# Patient Record
Sex: Female | Born: 1985 | Race: White | Hispanic: No | Marital: Single | Smoking: Current some day smoker
Health system: Southern US, Community
[De-identification: ages and names within clinical notes are randomized; demographics above are authoritative.]

## PROBLEM LIST (undated history)

## (undated) DIAGNOSIS — N760 Acute vaginitis: Secondary | ICD-10-CM

## (undated) DIAGNOSIS — F419 Anxiety disorder, unspecified: Secondary | ICD-10-CM

## (undated) DIAGNOSIS — B9689 Other specified bacterial agents as the cause of diseases classified elsewhere: Secondary | ICD-10-CM

## (undated) DIAGNOSIS — F410 Panic disorder [episodic paroxysmal anxiety] without agoraphobia: Secondary | ICD-10-CM

---
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
## 2007-11-07 ENCOUNTER — Emergency Department (HOSPITAL_COMMUNITY): Admission: EM | Admit: 2007-11-07 | Discharge: 2007-11-07 | Payer: Self-pay | Admitting: Emergency Medicine

## 2007-11-29 ENCOUNTER — Emergency Department (HOSPITAL_COMMUNITY): Admission: EM | Admit: 2007-11-29 | Discharge: 2007-11-30 | Payer: Self-pay | Admitting: Emergency Medicine

## 2008-05-21 ENCOUNTER — Encounter: Admission: RE | Admit: 2008-05-21 | Discharge: 2008-05-21 | Payer: Self-pay | Admitting: General Practice

## 2009-03-15 ENCOUNTER — Emergency Department (HOSPITAL_COMMUNITY): Admission: EM | Admit: 2009-03-15 | Discharge: 2009-03-15 | Payer: Self-pay | Admitting: *Deleted

## 2009-08-23 ENCOUNTER — Emergency Department (HOSPITAL_COMMUNITY): Admission: EM | Admit: 2009-08-23 | Discharge: 2009-08-23 | Payer: Self-pay | Admitting: Emergency Medicine

## 2010-12-21 ENCOUNTER — Emergency Department (HOSPITAL_COMMUNITY)
Admission: EM | Admit: 2010-12-21 | Discharge: 2010-12-21 | Disposition: A | Discharge status: Home / Self Care | Payer: Self-pay | Attending: Emergency Medicine | Admitting: Emergency Medicine

## 2010-12-21 DIAGNOSIS — R109 Unspecified abdominal pain: Secondary | ICD-10-CM | POA: Insufficient documentation

## 2010-12-21 DIAGNOSIS — R197 Diarrhea, unspecified: Secondary | ICD-10-CM | POA: Insufficient documentation

## 2010-12-21 LAB — URINALYSIS, ROUTINE W REFLEX MICROSCOPIC
Bilirubin Urine: NEGATIVE
Glucose, UA: NEGATIVE mg/dL
Hgb urine dipstick: NEGATIVE
Ketones, ur: NEGATIVE mg/dL
Nitrite: NEGATIVE
Protein, ur: NEGATIVE mg/dL
Specific Gravity, Urine: 1.011 (ref 1.005–1.030)
Urobilinogen, UA: 0.2 mg/dL (ref 0.0–1.0)
pH: 6 (ref 5.0–8.0)

## 2010-12-21 LAB — POCT I-STAT, CHEM 8
BUN: 13 mg/dL (ref 6–23)
Calcium, Ion: 1.2 mmol/L (ref 1.12–1.32)
Creatinine, Ser: 1 mg/dL (ref 0.4–1.2)
Hemoglobin: 13.6 g/dL (ref 12.0–15.0)
TCO2: 22 mmol/L (ref 0–100)

## 2010-12-21 LAB — POCT PREGNANCY, URINE: Preg Test, Ur: NEGATIVE

## 2011-01-16 LAB — URINALYSIS, ROUTINE W REFLEX MICROSCOPIC
Bilirubin Urine: NEGATIVE
Glucose, UA: NEGATIVE mg/dL
Hgb urine dipstick: NEGATIVE
Ketones, ur: NEGATIVE mg/dL
Nitrite: NEGATIVE
Protein, ur: NEGATIVE mg/dL
Specific Gravity, Urine: 1.019 (ref 1.005–1.030)
Urobilinogen, UA: 0.2 mg/dL (ref 0.0–1.0)
pH: 6 (ref 5.0–8.0)

## 2011-01-16 LAB — BASIC METABOLIC PANEL WITH GFR
CO2: 29 meq/L (ref 19–32)
Calcium: 9.4 mg/dL (ref 8.4–10.5)
Creatinine, Ser: 0.83 mg/dL (ref 0.4–1.2)
GFR calc Af Amer: >60 mL/min (ref >60)
GFR calc non Af Amer: >60 mL/min (ref >60)
Sodium: 141 meq/L (ref 135–145)

## 2011-01-16 LAB — DIFFERENTIAL
Basophils Absolute: 0.1 10*3/uL (ref 0.0–0.1)
Basophils Relative: 1 % (ref 0–1)
Eosinophils Absolute: 0.2 10*3/uL (ref 0.0–0.7)
Eosinophils Relative: 3 % (ref 0–5)
Lymphocytes Relative: 39 % (ref 12–46)
Lymphs Abs: 2 K/uL (ref 0.7–4.0)
Monocytes Absolute: 0.4 K/uL (ref 0.1–1.0)
Monocytes Relative: 9 % (ref 3–12)
Neutro Abs: 2.4 K/uL (ref 1.7–7.7)
Neutrophils Relative %: 48 % (ref 43–77)

## 2011-01-16 LAB — CBC
HCT: 40.3 % (ref 36.0–46.0)
Hemoglobin: 13.9 g/dL (ref 12.0–15.0)
MCHC: 34.3 g/dL (ref 30.0–36.0)
MCV: 94.4 fL (ref 78.0–100.0)
Platelets: 296 10*3/uL (ref 150–400)
RBC: 4.28 MIL/uL (ref 3.87–5.11)
RDW: 13 % (ref 11.5–15.5)
WBC: 5 K/uL (ref 4.0–10.5)

## 2011-01-16 LAB — POCT PREGNANCY, URINE: Preg Test, Ur: NEGATIVE

## 2011-01-16 LAB — BASIC METABOLIC PANEL
BUN: 9 mg/dL (ref 6–23)
Chloride: 104 mEq/L (ref 96–112)
Glucose, Bld: 95 mg/dL (ref 70–99)
Potassium: 3.7 mEq/L (ref 3.5–5.1)

## 2011-06-29 LAB — I-STAT 8, (EC8 V) (CONVERTED LAB)
Glucose, Bld: 83
Potassium: 4
TCO2: 28
pH, Ven: 7.337 — ABNORMAL HIGH

## 2011-06-29 LAB — POCT I-STAT CREATININE
Creatinine, Ser: 0.9
Operator id: 295021

## 2016-07-24 ENCOUNTER — Emergency Department (HOSPITAL_COMMUNITY): Payer: Self-pay

## 2016-07-24 ENCOUNTER — Emergency Department (HOSPITAL_COMMUNITY)
Admission: EM | Admit: 2016-07-24 | Discharge: 2016-07-24 | Disposition: A | Discharge status: Home / Self Care | Payer: Self-pay | Attending: Emergency Medicine | Admitting: Emergency Medicine

## 2016-07-24 ENCOUNTER — Encounter (HOSPITAL_COMMUNITY): Payer: Self-pay | Admitting: Emergency Medicine

## 2016-07-24 DIAGNOSIS — F12920 Cannabis use, unspecified with intoxication, uncomplicated: Secondary | ICD-10-CM

## 2016-07-24 DIAGNOSIS — E876 Hypokalemia: Secondary | ICD-10-CM | POA: Insufficient documentation

## 2016-07-24 DIAGNOSIS — R000 Tachycardia, unspecified: Secondary | ICD-10-CM

## 2016-07-24 DIAGNOSIS — F12129 Cannabis abuse with intoxication, unspecified: Secondary | ICD-10-CM | POA: Insufficient documentation

## 2016-07-24 HISTORY — DX: Other specified bacterial agents as the cause of diseases classified elsewhere: B96.89

## 2016-07-24 HISTORY — DX: Other specified bacterial agents as the cause of diseases classified elsewhere: N76.0

## 2016-07-24 LAB — BASIC METABOLIC PANEL
ANION GAP: 12 (ref 5–15)
BUN: 12 mg/dL (ref 6–20)
CALCIUM: 9.3 mg/dL (ref 8.9–10.3)
CO2: 20 mmol/L — AB (ref 22–32)
Chloride: 103 mmol/L (ref 101–111)
Creatinine, Ser: 1.03 mg/dL — ABNORMAL HIGH (ref 0.44–1.00)
Glucose, Bld: 111 mg/dL — ABNORMAL HIGH (ref 65–99)
Potassium: 2.8 mmol/L — ABNORMAL LOW (ref 3.5–5.1)
Sodium: 135 mmol/L (ref 135–145)

## 2016-07-24 LAB — CBC
HCT: 37.5 % (ref 36.0–46.0)
HEMOGLOBIN: 12.5 g/dL (ref 12.0–15.0)
MCH: 29.7 pg (ref 26.0–34.0)
MCHC: 33.3 g/dL (ref 30.0–36.0)
MCV: 89.1 fL (ref 78.0–100.0)
Platelets: 356 10*3/uL (ref 150–400)
RBC: 4.21 MIL/uL (ref 3.87–5.11)
RDW: 13.1 % (ref 11.5–15.5)
WBC: 10 10*3/uL (ref 4.0–10.5)

## 2016-07-24 LAB — I-STAT TROPONIN, ED: TROPONIN I, POC: 0 ng/mL (ref 0.00–0.08)

## 2016-07-24 MED ORDER — POTASSIUM CHLORIDE CRYS ER 20 MEQ PO TBCR
60.0000 meq | EXTENDED_RELEASE_TABLET | Freq: Once | ORAL | Status: AC
  Administered 2016-07-24: 60 meq via ORAL
  Filled 2016-07-24: qty 3

## 2016-07-24 NOTE — Discharge Instructions (Signed)
Avoid using marijuana in the future to prevent the symptoms from reoccurring. Try to increase the potassium content of your feet, see attachment for foods high in potassium. [facility] or call the number circled on your discharge paperwork to establish care with a primary care provider. Please return to emergency department if you develop any new or worsening symptoms.

## 2016-07-24 NOTE — ED Triage Notes (Signed)
C/o tachycardia x 4 hours since eating marijuana brownies.  Denies pain.

## 2016-07-24 NOTE — ED Provider Notes (Signed)
[facility] Provider Note   CSN: [id number] Arrival date & time: 07/24/16  0341     History   Chief Complaint Chief Complaint  Patient presents with  . Tachycardia    after eating marijuana brownies    HPI Bonnie Turner is a 30 y.o. female who was previously healthy who presents with tachycardia and chest tightness after eating marijuana brownies. Patient states she ate the brownies around 12:30 or 1 AM. She states she normally smokes marijuana 2 times per week and has never experienced symptoms like these. She does not know who she got the brownies from is concerned that it was laced. On my interview with the patient around 7 hours after ingestion, patient states she is basically feeling back to normal with some residual, mild chest tightness and "woozy feeling" in her head. Patient denies any chest pain, pleuritic symptoms, shortness of breath, abdominal pain, nausea, vomiting, urinary symptoms.  HPI  Past Medical History:  Diagnosis Date  . BV (bacterial vaginosis)     There are no active problems to display for this patient.   History reviewed. No pertinent surgical history.  OB History    No data available       Home Medications    Prior to Admission medications   Medication Sig Start Date End Date Taking? Authorizing Provider  metroNIDAZOLE (FLAGYL) 500 MG tablet Take 500 mg by mouth 2 (two) times daily. For 7 days   Yes Historical Provider, MD    Family History No family history on file.  Social History Social History  Substance Use Topics  . Smoking status: Never Smoker  . Smokeless tobacco: Never Used  . Alcohol use Yes     Allergies   Hepatitis b virus vaccine   Review of Systems Review of Systems  Constitutional: Negative for chills and fever.  HENT: Negative for facial swelling and sore throat.   Respiratory: Positive for chest tightness. Negative for shortness of breath.   Cardiovascular: Positive for palpitations. Negative  for chest pain.  Gastrointestinal: Negative for abdominal pain, nausea and vomiting.  Genitourinary: Negative for dysuria.  Musculoskeletal: Negative for back pain.  Skin: Negative for rash and wound.  Neurological: Negative for headaches.  Psychiatric/Behavioral: The patient is not nervous/anxious.      Physical Exam Updated Vital Signs BP 101/70 (BP Location: Right Arm)   Pulse 99   Temp 98.8 F (37.1 C) (Oral)   Resp 17   LMP 07/06/2016   SpO2 98%   Physical Exam  Constitutional: She appears well-developed and well-nourished. No distress.  HENT:  Head: Normocephalic and atraumatic.  Mouth/Throat: Oropharynx is clear and moist. No oropharyngeal exudate.  Eyes: Conjunctivae are normal. Pupils are equal, round, and reactive to light. Right eye exhibits no discharge. Left eye exhibits no discharge. No scleral icterus.  Neck: Normal range of motion. Neck supple. No thyromegaly present.  Cardiovascular: Normal rate, regular rhythm, normal heart sounds and intact distal pulses.  Exam reveals no gallop and no friction rub.   No murmur heard. Pulmonary/Chest: Effort normal and breath sounds normal. No stridor. No respiratory distress. She has no wheezes. She has no rales.  Abdominal: Soft. Bowel sounds are normal. She exhibits no distension. There is no tenderness. There is no rebound and no guarding.  Musculoskeletal: She exhibits no edema.  Lymphadenopathy:    She has no cervical adenopathy.  Neurological: She is alert. Coordination normal.  Skin: Skin is warm and dry. No rash noted. She is not diaphoretic.  No pallor.  Psychiatric: She has a normal mood and affect.  Nursing note and vitals reviewed.    ED Treatments / Results  Labs (all labs ordered are listed, but only abnormal results are displayed) Labs Reviewed  BASIC METABOLIC PANEL - Abnormal; Notable for the following:       Result Value   Potassium 2.8 (*)    CO2 20 (*)    Glucose, Bld 111 (*)    Creatinine, Ser  1.03 (*)    All other components within normal limits  CBC  I-STAT TROPOININ, ED    EKG  EKG Interpretation  Date/Time:  Monday July 24 2016 03:48:43 EDT Ventricular Rate:  172 PR Interval:  128 QRS Duration: 76 QT Interval:  284 QTC Calculation: 480 R Axis:   106 Text Interpretation:  Sinus tachycardia Rightward axis No previous tracing Confirmed by Denton LankSTEINL  MD, Caryn BeeKEVIN ([id number]) on 07/24/2016 8:11:05 AM       Radiology Dg Chest 2 View  Result Date: 07/24/2016 CLINICAL DATA:  30 year old female with tachycardia EXAM: CHEST  2 VIEW COMPARISON:  Chest radiograph dated 04/17/2009 FINDINGS: The heart size and mediastinal contours are within normal limits. Both lungs are clear. The visualized skeletal structures are unremarkable. IMPRESSION: No active cardiopulmonary disease. Electronically Signed   By: Elgie CollardArash  Radparvar M.D.   On: 07/24/2016 04:35    Procedures Procedures (including critical care time)  Medications Ordered in ED Medications  potassium chloride SA (K-DUR,KLOR-CON) CR tablet 60 mEq (60 mEq Oral Given 07/24/16 [id number])     Initial Impression / Assessment and Plan / ED Course  I have reviewed the triage vital signs and the nursing notes.  Pertinent labs & imaging results that were available during my care of the patient were reviewed by me and considered in my medical decision making (see chart for details).  Clinical Course    Patient with resolved tachycardia and resolving chest tightness following cannabis use. Assume cannabis intoxication. EKG shows sinus tachycardia, rightward axis. CBC unremarkable. BMP shows potassium 2.8, CO2 20, glucose 111, creatinine 1.03. Potassium was replaced orally in the ED. Patient has history of hypokalemia. Troponin 0.00. CXR shows no active cardiopulmonary disease. Patient feeling well and comfortable for discharge. Patient advised to increase her potassium intake in her diet. Patient also advised to discontinue marijuana use.  Patient was to follow up and establish care with a primary care provider. Patient understands and agrees with plan. Patient also evaluated by Dr. Denton LankSteinl who agrees with plan. Patient discharged in satisfactory condition.  Final Clinical Impressions(s) / ED Diagnoses   Final diagnoses:  Tachycardia  Cannabis intoxication without complication (HCC)  Hypokalemia    New Prescriptions New Prescriptions   No medications on file     Emi Holeslexandra M Menna Abeln, PA-C 07/24/16 0902    Cathren LaineKevin Steinl, MD 07/24/16 [phone]

## 2016-09-11 ENCOUNTER — Ambulatory Visit (HOSPITAL_COMMUNITY)
Admission: EM | Admit: 2016-09-11 | Discharge: 2016-09-11 | Disposition: A | Discharge status: Home / Self Care | Payer: Self-pay | Attending: Family Medicine | Admitting: Family Medicine

## 2016-09-11 ENCOUNTER — Encounter (HOSPITAL_COMMUNITY): Payer: Self-pay | Admitting: Emergency Medicine

## 2016-09-11 DIAGNOSIS — J40 Bronchitis, not specified as acute or chronic: Secondary | ICD-10-CM

## 2016-09-11 MED ORDER — HYDROCODONE-HOMATROPINE 5-1.5 MG/5ML PO SYRP: 5.0000 mL | ORAL_SOLUTION | Freq: Four times a day (QID) | ORAL | 0 refills | Status: DC | PRN

## 2016-09-11 MED ORDER — AZITHROMYCIN 250 MG PO TABS: 250.0000 mg | ORAL_TABLET | Freq: Every day | ORAL | 0 refills | Status: DC

## 2016-09-11 NOTE — ED Provider Notes (Signed)
[facility]    CSN: [id number] Arrival date & time: 09/11/16  1654     History   Chief Complaint Chief Complaint  Patient presents with  . Cough    HPI Bonnie Turner is a 30 y.o. female.   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 She comes in with 4 days of progressive cough and chest burning. She's had a severe cough to cause some incontinence.  Patient does not smoke cigarettes. She does not have asthma. She's had no fever.      Past Medical History:  Diagnosis Date  . BV (bacterial vaginosis)     There are no active problems to display for this patient.   History reviewed. No pertinent surgical history.  OB History    No data available       Home Medications    Prior to Admission medications   Medication Sig Start Date End Date Taking? Authorizing Provider  OVER THE COUNTER MEDICATION otc cough medicine   Yes Historical Provider, MD  azithromycin (ZITHROMAX) 250 MG tablet Take 1 tablet (250 mg total) by mouth daily. Take first 2 tablets together, then 1 every day until finished. 09/11/16   Elvina SidleKurt Audreena Sachdeva, MD  HYDROcodone-homatropine [facility](HYCODAN) 5-1.5 MG/5ML syrup Take 5 mLs by mouth every 6 (six) hours as needed for cough. 09/11/16   Elvina SidleKurt Jaeleen Inzunza, MD    Family History No family history on file.  Social History Social History  Substance Use Topics  . Smoking status: Never Smoker  . Smokeless tobacco: Never Used  . Alcohol use Yes     Allergies   Hepatitis b virus vaccine   Review of Systems Review of Systems  Constitutional: Negative.   HENT: Positive for sore throat.   Respiratory: Positive for cough.   Cardiovascular: Positive for chest pain.  Neurological: Negative.      Physical Exam Triage Vital Signs ED Triage Vitals  Enc Vitals Group     BP 09/11/16 1716 104/68     Pulse Rate 09/11/16 1716 79     Resp 09/11/16 1716 16     Temp 09/11/16 1716 98.8 F (37.1 C)     Temp Source 09/11/16 1716 Oral     SpO2  09/11/16 1716 100 %     Weight --      Height --      Head Circumference --      Peak Flow --      Pain Score 09/11/16 1715 6     Pain Loc --      Pain Edu? --      Excl. in GC? --    No data found.   Updated Vital Signs BP 104/68 (BP Location: Left Arm)   Pulse 79   Temp 98.8 F (37.1 C) (Oral)   Resp 16   LMP 09/10/2016   SpO2 100%   Physical Exam  Constitutional: She is oriented to person, place, and time. She appears well-developed and well-nourished.  HENT:  Right Ear: External ear normal.  Left Ear: External ear normal.  Mouth/Throat: Oropharynx is clear and moist.  Eyes: Conjunctivae and EOM are normal.  Neck: Normal range of motion. Neck supple.  Cardiovascular: Normal heart sounds.   Pulmonary/Chest: Effort normal and breath sounds normal.  Musculoskeletal: Normal range of motion.  Neurological: She is alert and oriented to person, place, and time.  Skin: Skin is warm and dry.  Nursing note and vitals reviewed.    UC Treatments / Results  Labs (all labs ordered are listed, but only abnormal results are displayed) Labs Reviewed - No data to display  EKG  EKG Interpretation None       Radiology No results found.  Procedures Procedures (including critical care time)  Medications Ordered in UC Medications - No data to display   Initial Impression / Assessment and Plan / UC Course  I have reviewed the triage vital signs and the nursing notes.  Pertinent labs & imaging results that were available during my care of the patient were reviewed by me and considered in my medical decision making (see chart for details).  Clinical Course     Final Clinical Impressions(s) / UC Diagnoses   Final diagnoses:  Bronchitis    New Prescriptions New Prescriptions   AZITHROMYCIN (ZITHROMAX) 250 MG TABLET    Take 1 tablet (250 mg total) by mouth daily. Take first 2 tablets together, then 1 every day until finished.   HYDROCODONE-HOMATROPINE (HYCODAN)  5-1.5 MG/5ML SYRUP    Take 5 mLs by mouth every 6 (six) hours as needed for cough.     Elvina SidleKurt Levita Monical, MD 09/11/16 Rickey [id number]

## 2016-09-11 NOTE — ED Triage Notes (Signed)
Reports 3 days of coughing.  Patient has chest congestion and feels like fire in chest.  Patient denies fever.  Patient reports sore throat, but no ear pain.

## 2016-11-27 ENCOUNTER — Ambulatory Visit (HOSPITAL_COMMUNITY)
Admission: EM | Admit: 2016-11-27 | Discharge: 2016-11-27 | Disposition: A | Discharge status: Home / Self Care | Payer: Self-pay | Attending: Internal Medicine | Admitting: Internal Medicine

## 2016-11-27 ENCOUNTER — Encounter (HOSPITAL_COMMUNITY): Payer: Self-pay | Admitting: Emergency Medicine

## 2016-11-27 DIAGNOSIS — J111 Influenza due to unidentified influenza virus with other respiratory manifestations: Secondary | ICD-10-CM

## 2016-11-27 DIAGNOSIS — R69 Illness, unspecified: Secondary | ICD-10-CM

## 2016-11-27 MED ORDER — OSELTAMIVIR PHOSPHATE 75 MG PO CAPS: 75.0000 mg | ORAL_CAPSULE | Freq: Two times a day (BID) | ORAL | 0 refills | Status: DC

## 2016-11-27 MED ORDER — BENZONATATE 100 MG PO CAPS: 100.0000 mg | ORAL_CAPSULE | Freq: Three times a day (TID) | ORAL | 0 refills | Status: DC

## 2016-11-27 NOTE — ED Triage Notes (Signed)
[facility] with a complaint of a sore throat, fatigue and general body aches x 2 days. The patient denied any fever at home.

## 2016-11-27 NOTE — ED Provider Notes (Signed)
CSN: [id number]     Arrival date & time 11/27/16  1108 History   First MD Initiated Contact with Patient 11/27/16 1259     Chief Complaint  Patient presents with  . Sore Throat  . Generalized Body Aches   (Consider location/radiation/quality/duration/timing/severity/associated sxs/prior Treatment) 31 year old female patient presents with 24-48 hour history of headache, fever, body aches, muscle aches, and congestion along with cough. Denies nausea, vomiting, or diarrhea, also denies abdominal pain, does have reduced appetite, but states she has been drinking normally.   The history is provided by the patient.  Sore Throat     Past Medical History:  Diagnosis Date  . BV (bacterial vaginosis)    History reviewed. No pertinent surgical history. History reviewed. No pertinent family history. Social History  Substance Use Topics  . Smoking status: Current Some Day Smoker    Types: Cigarettes  . Smokeless tobacco: Never Used  . Alcohol use Yes   OB History    No data available     Review of Systems  Reason unable to perform ROS: as covered in HPI.  All other systems reviewed and are negative.   Allergies  Hepatitis b virus vaccine  Home Medications   Prior to Admission medications   Medication Sig Start Date End Date Taking? Authorizing Provider  HYDROcodone-homatropine (HYCODAN) 5-1.5 MG/5ML syrup Take 5 mLs by mouth every 6 (six) hours as needed for cough.   Yes Historical Provider, MD  benzonatate (TESSALON) 100 MG capsule Take 1 capsule (100 mg total) by mouth every 8 (eight) hours. 11/27/16   Dorena Bodo, NP  oseltamivir (TAMIFLU) 75 MG capsule Take 1 capsule (75 mg total) by mouth every 12 (twelve) hours. 11/27/16   Dorena Bodo, NP   Meds Ordered and Administered this Visit  Medications - No data to display  BP 107/56 (BP Location: Right Arm)   Pulse 112   Temp 99.5 F (37.5 C) (Oral)   Resp 16   SpO2 99%  No data found.   Physical Exam   Constitutional: She is oriented to person, place, and time. She appears well-developed and well-nourished. She appears ill. No distress.  HENT:  Head: Normocephalic and atraumatic.  Right Ear: Tympanic membrane and external ear normal.  Left Ear: Tympanic membrane and external ear normal.  Nose: Rhinorrhea present. Right sinus exhibits no maxillary sinus tenderness and no frontal sinus tenderness. Left sinus exhibits no maxillary sinus tenderness and no frontal sinus tenderness.  Mouth/Throat: Uvula is midline and oropharynx is clear and moist. No oropharyngeal exudate.  Eyes: Pupils are equal, round, and reactive to light.  Neck: Normal range of motion. Neck supple. No JVD present.  Cardiovascular: Normal rate and regular rhythm.   Pulmonary/Chest: Effort normal and breath sounds normal. No respiratory distress. She has no wheezes.  Abdominal: Soft. Bowel sounds are normal. She exhibits no distension. There is no tenderness. There is no guarding.  Lymphadenopathy:       Head (right side): No submental, no submandibular and no tonsillar adenopathy present.       Head (left side): No submental, no submandibular and no tonsillar adenopathy present.    She has no cervical adenopathy.  Neurological: She is alert and oriented to person, place, and time.  Skin: Skin is warm and dry. Capillary refill takes less than 2 seconds. She is not diaphoretic.  Psychiatric: She has a normal mood and affect.  Nursing note and vitals reviewed.   Urgent Care Course     Procedures (including  critical care time)  Labs Review Labs Reviewed - No data to display  Imaging Review No results found.   Visual Acuity Review  Right Eye Distance:   Left Eye Distance:   Bilateral Distance:    Right Eye Near:   Left Eye Near:    Bilateral Near:         MDM   1. Influenza-like illness    You most likely have a viral URI such as influenza, I advise rest, plenty of fluids and management of symptoms  with over the counter medicines. For symptoms you may take Tylenol as needed every 4-6 hours for body aches or fever, not to exceed 4,000 mg a day, Take mucinex or mucinex DM ever 12 hours with a full glass of water, you may use an inhaled steroid such as Flonase, 2 sprays each nostril once a day for congestion, or an antihistamine such as Claritin or Zyrtec once a day. I have prescribed Tamiflu, take 1 tablet twice a day for 5 days, For cough, I have prescribed a medication called Tessalon. Take 1 tablet every 8 hours as needed for your cough. Should your symptoms worsen or fail to resolve, follow up with your primary care provider or return to clinic.       Dorena BodoLawrence Tijah Hane, NP 11/27/16 1314

## 2016-11-27 NOTE — Discharge Instructions (Signed)
You most likely have a viral URI such as influenza, I advise rest, plenty of fluids and management of symptoms with over the counter medicines. For symptoms you may take Tylenol as needed every 4-6 hours for body aches or fever, not to exceed 4,000 mg a day, Take mucinex or mucinex DM ever 12 hours with a full glass of water, you may use an inhaled steroid such as Flonase, 2 sprays each nostril once a day for congestion, or an antihistamine such as Claritin or Zyrtec once a day. I have prescribed Tamiflu, take 1 tablet twice a day for 5 days, For cough, I have prescribed a medication called Tessalon. Take 1 tablet every 8 hours as needed for your cough. Should your symptoms worsen or fail to resolve, follow up with your primary care provider or return to clinic.

## 2016-12-03 ENCOUNTER — Encounter (HOSPITAL_COMMUNITY): Payer: Self-pay | Admitting: *Deleted

## 2016-12-03 ENCOUNTER — Emergency Department (HOSPITAL_COMMUNITY)
Admission: EM | Admit: 2016-12-03 | Discharge: 2016-12-03 | Disposition: A | Discharge status: Home / Self Care | Payer: Self-pay | Attending: Emergency Medicine | Admitting: Emergency Medicine

## 2016-12-03 ENCOUNTER — Emergency Department (HOSPITAL_COMMUNITY): Payer: Self-pay

## 2016-12-03 DIAGNOSIS — F17210 Nicotine dependence, cigarettes, uncomplicated: Secondary | ICD-10-CM | POA: Insufficient documentation

## 2016-12-03 DIAGNOSIS — R509 Fever, unspecified: Secondary | ICD-10-CM | POA: Insufficient documentation

## 2016-12-03 DIAGNOSIS — R05 Cough: Secondary | ICD-10-CM | POA: Insufficient documentation

## 2016-12-03 DIAGNOSIS — Z79899 Other long term (current) drug therapy: Secondary | ICD-10-CM | POA: Insufficient documentation

## 2016-12-03 DIAGNOSIS — R6889 Other general symptoms and signs: Secondary | ICD-10-CM

## 2016-12-03 MED ORDER — ALBUTEROL SULFATE HFA 108 (90 BASE) MCG/ACT IN AERS
2.0000 | INHALATION_SPRAY | Freq: Once | RESPIRATORY_TRACT | Status: AC
  Administered 2016-12-03: 2 via RESPIRATORY_TRACT
  Filled 2016-12-03: qty 6.7

## 2016-12-03 NOTE — ED Provider Notes (Signed)
[facility]   CSN: [id number] Arrival date & time: 12/03/16  [id number]     History   Chief Complaint Chief Complaint  Patient presents with  . Influenza    HPI Bonnie Turner is a 31 y.o. female.  The history is provided by the patient.  Influenza  Presenting symptoms: cough, fatigue, fever, headache, myalgias, shortness of breath and sore throat   Presenting symptoms: no diarrhea   Severity:  Moderate Onset quality:  Gradual Duration:  1 week Progression:  Waxing and waning Chronicity:  New Relieved by:  Nothing Exacerbated by: cough. Associated symptoms: chills   Patient without any significant medical conditions presents with flu like illness   She was seen on 2/19 for congestion, sore throat, myalgias and given clinical diagnosis of influenza.  She was placed on tamiflu with some improvement In past 24 hours she is having worsening cough and chest burning while coughing No hemoptysis She reports continued HA and sore throat  No recent foreign travel  She smokes marijuana occasionally  She also report rash to left LE, reports it itches, thinks it is from her dogs   Past Medical History:  Diagnosis Date  . BV (bacterial vaginosis)     There are no active problems to display for this patient.   History reviewed. No pertinent surgical history.  OB History    No data available       Home Medications    Prior to Admission medications   Medication Sig Start Date End Date Taking? Authorizing Provider  benzonatate (TESSALON) 100 MG capsule Take 1 capsule (100 mg total) by mouth every 8 (eight) hours. 11/27/16   Dorena BodoLawrence Kennard, NP  HYDROcodone-homatropine [facility](HYCODAN) 5-1.5 MG/5ML syrup Take 5 mLs by mouth every 6 (six) hours as needed for cough.    Historical Provider, MD  oseltamivir (TAMIFLU) 75 MG capsule Take 1 capsule (75 mg total) by mouth every 12 (twelve) hours. 11/27/16   Dorena BodoLawrence Kennard, NP    Family History No family history  on file.  Social History Social History  Substance Use Topics  . Smoking status: Current Some Day Smoker    Types: Cigarettes  . Smokeless tobacco: Never Used  . Alcohol use Yes     Allergies   Hepatitis b virus vaccine   Review of Systems Review of Systems  Constitutional: Positive for chills, fatigue and fever.  HENT: Positive for sore throat.   Respiratory: Positive for cough and shortness of breath.   Gastrointestinal: Negative for diarrhea.  Musculoskeletal: Positive for myalgias.  Skin: Positive for rash.  Neurological: Positive for headaches.  All other systems reviewed and are negative.    Physical Exam Updated Vital Signs BP 111/84   Pulse 94   Temp 97.5 F (36.4 C) (Oral)   Resp 16   LMP 11/26/2016   SpO2 100%   Physical Exam CONSTITUTIONAL: Well developed/well nourished HEAD: Normocephalic/atraumatic EYES: EOMI/PERRL ENMT: Mucous membranes moist NECK: supple no meningeal signs SPINE/BACK:entire spine nontender CV: S1/S2 noted, no murmurs/rubs/gallops noted LUNGS: Lungs are clear to auscultation bilaterally, no apparent distress ABDOMEN: soft, nontender, no rebound or guarding, bowel sounds noted throughout abdomen GU:no cva tenderness NEURO: Pt is awake/alert/appropriate, moves all extremitiesx4.  No facial droop.   EXTREMITIES: pulses normal/equal, full ROM SKIN: warm, color normal, scattered patches of dermatitis on left LE, none on palms/soles PSYCH: no abnormalities of mood noted, alert and oriented to situation   ED Treatments / Results  Labs (all labs ordered are listed,  but only abnormal results are displayed) Labs Reviewed - No data to display  EKG  EKG Interpretation None       Radiology Dg Chest 2 View  Result Date: 12/03/2016 CLINICAL DATA:  31 year old female with flu-like symptoms. EXAM: CHEST  2 VIEW COMPARISON:  Chest radiograph dated 07/24/2016 FINDINGS: The heart size and mediastinal contours are within normal limits.  Both lungs are clear. The visualized skeletal structures are unremarkable. IMPRESSION: No active cardiopulmonary disease. Electronically Signed   By: Elgie Collard M.D.   On: 12/03/2016 06:21    Procedures Procedures (including critical care time)  Medications Ordered in ED Medications  albuterol (PROVENTIL HFA;VENTOLIN HFA) 108 (90 Base) MCG/ACT inhaler 2 puff (2 puffs Inhalation Given 12/03/16 1610)     Initial Impression / Assessment and Plan / ED Course  I have reviewed the triage vital signs and the nursing notes.  Pertinent imaging results that were available during my care of the patient were reviewed by me and considered in my medical decision making (see chart for details).     Pt here with recent flu like symptoms who presents with increased cough and chest burning cxr negative No distress noted Will d/c home Final Clinical Impressions(s) / ED Diagnoses   Final diagnoses:  Flu-like symptoms    New Prescriptions New Prescriptions   No medications on file     Zadie Rhine, MD 12/03/16 [phone]

## 2016-12-03 NOTE — ED Triage Notes (Signed)
Pt was diagnosed with the flu last Monday, was referred to the ED if symptoms worsened. Now pt reports constant coughing with burning in chest. Has been taking tamiflu (which pt finished), tessalon pearls, and flonase without relief. Also pt has a rash to L leg

## 2016-12-08 ENCOUNTER — Ambulatory Visit (HOSPITAL_COMMUNITY)
Admission: EM | Admit: 2016-12-08 | Discharge: 2016-12-08 | Disposition: A | Discharge status: Home / Self Care | Payer: Self-pay | Attending: Family Medicine | Admitting: Family Medicine

## 2016-12-08 ENCOUNTER — Encounter (HOSPITAL_COMMUNITY): Payer: Self-pay | Admitting: Family Medicine

## 2016-12-08 DIAGNOSIS — R058 Other specified cough: Secondary | ICD-10-CM

## 2016-12-08 DIAGNOSIS — R05 Cough: Secondary | ICD-10-CM

## 2016-12-08 MED ORDER — HYDROCODONE-HOMATROPINE 5-1.5 MG/5ML PO SYRP: 5.0000 mL | ORAL_SOLUTION | Freq: Four times a day (QID) | ORAL | 0 refills | Status: DC | PRN

## 2016-12-08 MED ORDER — BENZONATATE 100 MG PO CAPS: 100.0000 mg | ORAL_CAPSULE | Freq: Three times a day (TID) | ORAL | 0 refills | Status: DC | PRN

## 2016-12-08 NOTE — ED Triage Notes (Signed)
Pt here for 2 weeks of coughing and not getting better. sts taht she recently had the flu. sts she took tamiflu, mucinex and tesslon and not getting better.

## 2016-12-08 NOTE — ED Provider Notes (Signed)
CSN: [id number]     Arrival date & time 12/08/16  1012 History   None    Chief Complaint  Patient presents with  . Cough   (Consider location/radiation/quality/duration/timing/severity/associated sxs/prior Treatment) Onset: sick for 1 week with the flu Course: patient developed the cough this week. Fever, chills and diarrhea have resolved. However continues to have cough. Has tried tesslon perles and NyQUIL with much benefit  Severity: feels severe, wakes you up  Worse with: talking makes it worse  Better with: nothing   Symptoms Sputum:no  Fever: no  Shortness of breath:no  Leg Swelling:no  Heart Burn or Reflux:no  Wheezing:no  Post Nasal Drip: no   Red Flags Weight Loss:  no Immunocompromised:  no  PMH Asthma or COPD: no  PMH of Smoking: yes, intermittently   Using ACEIs: no          Past Medical History:  Diagnosis Date  . BV (bacterial vaginosis)    History reviewed. No pertinent surgical history. History reviewed. No pertinent family history. Social History  Substance Use Topics  . Smoking status: Current Some Day Smoker    Types: Cigarettes  . Smokeless tobacco: Never Used  . Alcohol use Yes   OB History    No data available     Review of Systems  Allergies  Hepatitis b virus vaccine  Home Medications   Prior to Admission medications   Medication Sig Start Date End Date Taking? Authorizing Provider  benzonatate (TESSALON PERLES) 100 MG capsule Take 1 capsule (100 mg total) by mouth 3 (three) times daily as needed for cough. 12/08/16   Toretto Tingler Mayra Reel, MD  benzonatate (TESSALON) 100 MG capsule Take 1 capsule (100 mg total) by mouth every 8 (eight) hours. 11/27/16   Dorena Bodo, NP  dextromethorphan-guaiFENesin [facility] DM) 30-600 MG 12hr tablet Take 1 tablet by mouth 2 (two) times daily.    Historical Provider, MD  fluticasone (FLONASE) 50 MCG/ACT nasal spray Place 1 spray into both nostrils daily.    Historical Provider, MD   HYDROcodone-homatropine (HYCODAN) 5-1.5 MG/5ML syrup Take 5 mLs by mouth every 6 (six) hours as needed for cough. 12/08/16   Ryoma Nofziger Mayra Reel, MD  oseltamivir (TAMIFLU) 75 MG capsule Take 1 capsule (75 mg total) by mouth every 12 (twelve) hours. Patient not taking: Reported on 12/03/2016 11/27/16   Dorena Bodo, NP   Meds Ordered and Administered this Visit  Medications - No data to display  BP 110/77   Pulse 98   Temp 98.3 F (36.8 C) (Oral)   Resp 18   LMP 11/26/2016   SpO2 98%  No data found.   Physical Exam  Constitutional: She is oriented to person, place, and time. She appears well-developed and well-nourished.  HENT:  Head: Normocephalic and atraumatic.  Nose: Nose normal.  Mouth/Throat: Oropharynx is clear and moist.  Eyes: EOM are normal. Pupils are equal, round, and reactive to light.  Neck: Normal range of motion. Neck supple.  Cardiovascular: Normal rate and regular rhythm.   Pulmonary/Chest: Effort normal and breath sounds normal.  Abdominal: Soft. Bowel sounds are normal.  Musculoskeletal: Normal range of motion.  Neurological: She is alert and oriented to person, place, and time.  Skin: Skin is warm and dry.    Urgent Care Course     Procedures (including critical care time)  Labs Review Labs Reviewed - No data to display  Imaging Review No results found.      MDM   1. Post-viral cough syndrome  Recent treated for flu, no further fevers chills or body aches. States that she has clinically improved other than having a dry cough that keeps her awake at night. Discussed with Dr. Artis FlockKindl, seen and evaluated by him  Meds ordered this encounter  Medications  . HYDROcodone-homatropine (HYCODAN) 5-1.5 MG/5ML syrup    Sig: Take 5 mLs by mouth every 6 (six) hours as needed for cough.    Dispense:  120 mL    Refill:  0  . benzonatate (TESSALON PERLES) 100 MG capsule    Sig: Take 1 capsule (100 mg total) by mouth 3 (three) times daily as needed for  cough.    Dispense:  20 capsule    Refill:  0     Matthieu Loftus Mayra ReelZahra Ravyn Nikkel, MD 12/08/16 1119

## 2016-12-18 ENCOUNTER — Ambulatory Visit (HOSPITAL_COMMUNITY)
Admission: EM | Admit: 2016-12-18 | Discharge: 2016-12-18 | Disposition: A | Discharge status: Home / Self Care | Payer: Self-pay | Attending: Emergency Medicine | Admitting: Emergency Medicine

## 2016-12-18 ENCOUNTER — Encounter (HOSPITAL_COMMUNITY): Payer: Self-pay | Admitting: Emergency Medicine

## 2016-12-18 DIAGNOSIS — R059 Cough, unspecified: Secondary | ICD-10-CM

## 2016-12-18 DIAGNOSIS — R05 Cough: Secondary | ICD-10-CM

## 2016-12-18 DIAGNOSIS — R0982 Postnasal drip: Secondary | ICD-10-CM

## 2016-12-18 MED ORDER — AZITHROMYCIN 250 MG PO TABS: 250.0000 mg | ORAL_TABLET | Freq: Every day | ORAL | 0 refills | Status: DC

## 2016-12-18 MED ORDER — ALBUTEROL SULFATE HFA 108 (90 BASE) MCG/ACT IN AERS: 1.0000 | INHALATION_SPRAY | Freq: Four times a day (QID) | RESPIRATORY_TRACT | 0 refills | Status: DC | PRN

## 2016-12-18 MED ORDER — AEROCHAMBER PLUS MISC: 2 refills | Status: DC

## 2016-12-18 MED ORDER — PREDNISONE 50 MG PO TABS: 50.0000 mg | ORAL_TABLET | Freq: Every day | ORAL | 0 refills | Status: DC

## 2016-12-18 NOTE — ED Provider Notes (Signed)
HPI  SUBJECTIVE:  Bonnie Turner is a 31 y.o. female who presents with a persistent cough which she has had for several weeks. She reports wheezing, shortness of breath with talking because she is trying to stop coughing, nasal congestion, rhinorrhea, postnasal drip and burning chest pain with cough. She reports allergy type symptoms of itchy watery eyes, sneezing. She tried Psychiatrist, Engineer, technical sales and Mucinex DM. The Hycodan seems to help temporarily. Symptoms are worse with going out in the cold air. She denies fevers, chest pain, dyspnea on exertion, GERD symptoms. Patient is a smoker. Patient had influenza-like illness in mid February, seen here 10 days ago and thought to have postviral cough syndrome. Sent home with Hycodan and [employer]. History of asthma, emphysema, COPD, GERD, diabetes, hypertension. LMP: 2/1. Denies possibility pregnant. PMD: None.  Past Medical History:  Diagnosis Date  . BV (bacterial vaginosis)     History reviewed. No pertinent surgical history.  History reviewed. No pertinent family history.  Social History  Substance Use Topics  . Smoking status: Current Some Day Smoker    Types: Cigarettes  . Smokeless tobacco: Never Used  . Alcohol use Yes    No current facility-administered medications for this encounter.   Current Outpatient Prescriptions:  .  albuterol (PROVENTIL HFA;VENTOLIN HFA) 108 (90 Base) MCG/ACT inhaler, Inhale 1-2 puffs into the lungs every 6 (six) hours as needed for wheezing or shortness of breath., Disp: 1 Inhaler, Rfl: 0 .  azithromycin (ZITHROMAX) 250 MG tablet, Take 1 tablet (250 mg total) by mouth daily. 2 tabs po on day one, then one tablet po once daily on days 2-5., Disp: 6 tablet, Rfl: 0 .  predniSONE (DELTASONE) 50 MG tablet, Take 1 tablet (50 mg total) by mouth daily with breakfast., Disp: 5 tablet, Rfl: 0 .  Spacer/Aero-Holding Chambers (AEROCHAMBER PLUS) inhaler, Use as instructed, Disp: 1 each, Rfl: 2  Allergies  Allergen  Reactions  . Hepatitis B Virus Vaccine Hives and Swelling    AT INJECTION SITE     ROS  As noted in HPI.   Physical Exam  BP 131/78 (BP Location: Right Arm)   Pulse 107   Temp 97.8 F (36.6 C)   LMP 12/07/2016 (Exact Date)   SpO2 99%   Constitutional: Well developed, well nourished, no acute distress. Positive dry nonproductive cough Eyes:  EOMI, conjunctiva normal bilaterally HENT: Normocephalic, atraumatic,mucus membranes moist Positive clear rhinorrhea. Positive postnasal drip with cobblestoning. Respiratory: Normal inspiratory effort, lungs clear bilaterally. Positive chest wall tenderness Cardiovascular: Mild tachycardia, no murmurs rubs or gallops GI: nondistended skin: No rash, skin intact Musculoskeletal: no deformities Neurologic: Alert & oriented x 3, no focal neuro deficits Psychiatric: Speech and behavior appropriate   ED Course   Medications - No data to display  No orders of the defined types were placed in this encounter.   No results found for this or any previous visit (from the past 24 hour(s)). No results found.  ED Clinical Impression  Cough  Postnasal drip   ED Assessment/Plan  Previous records reviewed. As noted in history of present illness.  Presentation most consistent with cough from postnasal drip and she may have a component of continued postviral cough syndrome. Because she tells me that she is wheezing we'll send her home with albuterol with spacer, prednisone. She will restart Flonase and some saline nasal irrigation. We'll have her discontinue the Mucinex and have her start Claritin, Allegra, or Zyrtec D because of itchy, watery eyes and sneezing. If all  of these things do not work, we'll send home with a wait-and-see prescription of azithromycin. Advised her to try these things for a week prior to starting the azithromycin. We'll write a primary care referral.    Discussed MDM, plan and followup with patient. . Patient agrees  with plan.   Meds ordered this encounter  Medications  . predniSONE (DELTASONE) 50 MG tablet    Sig: Take 1 tablet (50 mg total) by mouth daily with breakfast.    Dispense:  5 tablet    Refill:  0  . Spacer/Aero-Holding Chambers (AEROCHAMBER PLUS) inhaler    Sig: Use as instructed    Dispense:  1 each    Refill:  2  . albuterol (PROVENTIL HFA;VENTOLIN HFA) 108 (90 Base) MCG/ACT inhaler    Sig: Inhale 1-2 puffs into the lungs every 6 (six) hours as needed for wheezing or shortness of breath.    Dispense:  1 Inhaler    Refill:  0  . azithromycin (ZITHROMAX) 250 MG tablet    Sig: Take 1 tablet (250 mg total) by mouth daily. 2 tabs po on day one, then one tablet po once daily on days 2-5.    Dispense:  6 tablet    Refill:  0    *This clinic note was created using Scientist, clinical (histocompatibility and immunogenetics)Dragon dictation software. Therefore, there may be occasional mistakes despite careful proofreading.  ?   Domenick GongAshley Astrid Vides, MD 12/18/16 1154

## 2016-12-18 NOTE — Discharge Instructions (Signed)
restart Flonase and some saline nasal irrigation a neti pot or Lloyd Hugereil med sinus rinse . discontinue the Mucinex.  start Claritin, Allegra, or Zyrtec D to help with the postnasal drip. 2 puffs from your albuterol inhaler with a spacer every 4-6 hours as needed for coughing, wheezing, shortness of breath. Make sure he finished the steroids. wait to fill the azithromycin. Give these other medicines a week to do their job. Follow-up with a primary care physician or your choice, see the list below   Below is a list of primary care practices who are taking new patients for you to follow-up with. [facility] [address] [place], [place] [address] [phone]  [facility] [phone] [address] [place] [address]  [facility]: [address] [place] [id number]  [phone]  [facility]: [address] [place] [address]   [phone]  [facility]: [address] [place] [address]  [phone]  [facility] : [address] [place] [address] [phone]  [facility]: [address] [place] [phone]  [facility]: [address] [place] [address] [phone]  Dr. Oneal GroutMahima Pandey [address] [facility] [place] [id number]  [phone]  Dr. Jackie PlumGeorge Osei-Bonsu, [facility]. [address] [place], [place] [address]  [phone]

## 2016-12-18 NOTE — ED Triage Notes (Signed)
Pt complained of cough x 2 weeks. Pt was evaluated for the same complaint of 12/08/16

## 2017-06-15 ENCOUNTER — Ambulatory Visit (HOSPITAL_COMMUNITY)
Admission: EM | Admit: 2017-06-15 | Discharge: 2017-06-15 | Disposition: A | Discharge status: Home / Self Care | Payer: Self-pay | Attending: Emergency Medicine | Admitting: Emergency Medicine

## 2017-06-15 ENCOUNTER — Encounter (HOSPITAL_COMMUNITY): Payer: Self-pay | Admitting: Family Medicine

## 2017-06-15 DIAGNOSIS — J00 Acute nasopharyngitis [common cold]: Secondary | ICD-10-CM

## 2017-06-15 MED ORDER — IPRATROPIUM BROMIDE 0.06 % NA SOLN: 2.0000 | Freq: Four times a day (QID) | NASAL | 0 refills | Status: DC

## 2017-06-15 MED ORDER — CETIRIZINE-PSEUDOEPHEDRINE ER 5-120 MG PO TB12: 1.0000 | ORAL_TABLET | Freq: Every day | ORAL | 0 refills | Status: DC

## 2017-06-15 MED ORDER — BENZONATATE 100 MG PO CAPS: 100.0000 mg | ORAL_CAPSULE | Freq: Three times a day (TID) | ORAL | 0 refills | Status: DC

## 2017-06-15 NOTE — Discharge Instructions (Addendum)
Tessalon for cough. Start atrovent nasal spray, zyrtec-D for nasal congestion. You can use over the counter nasal saline rinse such as neti pot for nasal congestion. Keep hydrated, your urine should be clear to pale yellow in color. Tylenol/motrin for fever and pain. Monitor for any worsening of symptoms, chest pain, shortness of breath, wheezing, swelling of the throat, follow up for reevaluation.

## 2017-06-15 NOTE — ED Triage Notes (Signed)
Pt here for productive cough and burning in chest with coughing.

## 2017-06-15 NOTE — ED Provider Notes (Signed)
[facility]    CSN: [id number] Arrival date & time: 06/15/17  1549     History   Chief Complaint Chief Complaint  Patient presents with  . Cough    HPI Bonnie Turner is a 31 y.o. female.   31 year old female comes in for 3 day history of productive cough, chest congestion. Denies fever, chills, night sweats. States has had some chest pain with coughing, otherwise denies shortness of breath, wheezing. Denies ear pain, eye pain. States has a little bit of sore throat, and nasal drainage. She has been taking ibuprofen without relief. She is a some day smoker, states she felt symptom onset after the last time she smoked about a week ago. She states coughing is worse at night.      Past Medical History:  Diagnosis Date  . BV (bacterial vaginosis)     There are no active problems to display for this patient.   History reviewed. No pertinent surgical history.  OB History    No data available       Home Medications    Prior to Admission medications   Medication Sig Start Date End Date Taking? Authorizing Provider  albuterol (PROVENTIL HFA;VENTOLIN HFA) 108 (90 Base) MCG/ACT inhaler Inhale 1-2 puffs into the lungs every 6 (six) hours as needed for wheezing or shortness of breath. 12/18/16   Domenick GongMortenson, Ashley, MD  azithromycin (ZITHROMAX) 250 MG tablet Take 1 tablet (250 mg total) by mouth daily. 2 tabs po on day one, then one tablet po once daily on days 2-5. 12/18/16   Domenick GongMortenson, Ashley, MD  benzonatate (TESSALON) 100 MG capsule Take 1 capsule (100 mg total) by mouth every 8 (eight) hours. 06/15/17   Cathie HoopsYu, Ziya Coonrod V, PA-C  cetirizine-pseudoephedrine (ZYRTEC-D) 5-120 MG tablet Take 1 tablet by mouth daily. 06/15/17   Cathie HoopsYu, Brecklynn Jian V, PA-C  ipratropium (ATROVENT) 0.06 % nasal spray Place 2 sprays into both nostrils 4 (four) times daily. 06/15/17   Belinda FisherYu, Mabel Roll V, PA-C  predniSONE (DELTASONE) 50 MG tablet Take 1 tablet (50 mg total) by mouth daily with breakfast. 12/18/16   Domenick GongMortenson,  Ashley, MD  Spacer/Aero-Holding Chambers (AEROCHAMBER PLUS) inhaler Use as instructed 12/18/16   Domenick GongMortenson, Ashley, MD    Family History History reviewed. No pertinent family history.  Social History Social History  Substance Use Topics  . Smoking status: Current Some Day Smoker    Types: Cigarettes  . Smokeless tobacco: Never Used  . Alcohol use Yes     Allergies   Hepatitis b virus vaccine   Review of Systems Review of Systems  Reason unable to perform ROS: See HPI as above.     Physical Exam Triage Vital Signs ED Triage Vitals [06/15/17 1636]  Enc Vitals Group     BP 106/62     Pulse Rate 99     Resp 18     Temp 98.5 F (36.9 C)     Temp Source Oral     SpO2 99 %     Weight      Height      Head Circumference      Peak Flow      Pain Score      Pain Loc      Pain Edu?      Excl. in GC?    No data found.   Updated Vital Signs BP 106/62   Pulse 99   Temp 98.5 F (36.9 C) (Oral)   Resp 18   LMP 06/15/2017  SpO2 99%   Visual Acuity Right Eye Distance:   Left Eye Distance:   Bilateral Distance:    Right Eye Near:   Left Eye Near:    Bilateral Near:     Physical Exam  Constitutional: She is oriented to person, place, and time. She appears well-developed and well-nourished. No distress.  HENT:  Head: Normocephalic and atraumatic.  Right Ear: Tympanic membrane, external ear and ear canal normal. Tympanic membrane is not erythematous and not bulging.  Left Ear: Tympanic membrane, external ear and ear canal normal. Tympanic membrane is not erythematous and not bulging.  Nose: Mucosal edema and rhinorrhea present. Right sinus exhibits no maxillary sinus tenderness and no frontal sinus tenderness. Left sinus exhibits no maxillary sinus tenderness and no frontal sinus tenderness.  Mouth/Throat: Uvula is midline and mucous membranes are normal. Posterior oropharyngeal erythema present.  Eyes: Pupils are equal, round, and reactive to light. Conjunctivae  are normal.  Neck: Normal range of motion. Neck supple.  Cardiovascular: Normal rate, regular rhythm and normal heart sounds.  Exam reveals no gallop and no friction rub.   No murmur heard. Pulmonary/Chest: Effort normal and breath sounds normal. She has no decreased breath sounds. She has no wheezes. She has no rhonchi. She has no rales.  Lymphadenopathy:    She has no cervical adenopathy.  Neurological: She is alert and oriented to person, place, and time.  Skin: Skin is warm and dry.  Psychiatric: She has a normal mood and affect. Her behavior is normal. Judgment normal.     UC Treatments / Results  Labs (all labs ordered are listed, but only abnormal results are displayed) Labs Reviewed - No data to display  EKG  EKG Interpretation None       Radiology No results found.  Procedures Procedures (including critical care time)  Medications Ordered in UC Medications - No data to display   Initial Impression / Assessment and Plan / UC Course  I have reviewed the triage vital signs and the nursing notes.  Pertinent labs & imaging results that were available during my care of the patient were reviewed by me and considered in my medical decision making (see chart for details).    Discussed with patient history and exam most consistent with viral URI. Symptomatic treatment as needed. Push fluids. Return precautions given.   Final Clinical Impressions(s) / UC Diagnoses   Final diagnoses:  Acute nasopharyngitis    New Prescriptions New Prescriptions   BENZONATATE (TESSALON) 100 MG CAPSULE    Take 1 capsule (100 mg total) by mouth every 8 (eight) hours.   CETIRIZINE-PSEUDOEPHEDRINE (ZYRTEC-D) 5-120 MG TABLET    Take 1 tablet by mouth daily.   IPRATROPIUM (ATROVENT) 0.06 % NASAL SPRAY    Place 2 sprays into both nostrils 4 (four) times daily.       Belinda Fisher, PA-C 06/15/17 1727

## 2017-06-22 ENCOUNTER — Ambulatory Visit (HOSPITAL_COMMUNITY)
Admission: EM | Admit: 2017-06-22 | Discharge: 2017-06-22 | Disposition: A | Discharge status: Home / Self Care | Payer: Self-pay | Attending: Family Medicine | Admitting: Family Medicine

## 2017-06-22 ENCOUNTER — Encounter (HOSPITAL_COMMUNITY): Payer: Self-pay | Admitting: Family Medicine

## 2017-06-22 DIAGNOSIS — J40 Bronchitis, not specified as acute or chronic: Secondary | ICD-10-CM

## 2017-06-22 MED ORDER — PREDNISONE 20 MG PO TABS: ORAL_TABLET | ORAL | 0 refills | Status: DC

## 2017-06-22 MED ORDER — AZITHROMYCIN 250 MG PO TABS: 250.0000 mg | ORAL_TABLET | Freq: Every day | ORAL | 0 refills | Status: DC

## 2017-06-22 NOTE — ED Triage Notes (Signed)
Pt here for continued cough and worsening symptoms.

## 2017-06-22 NOTE — ED Provider Notes (Signed)
[facility]   [id number] 06/22/17 Arrival Time: 1012   SUBJECTIVE:  Bonnie Turner is a 31 y.o. female who presents to the urgent care with complaint  here for continued cough and worsening symptoms.   Patient is unemployed.  No asthma hx.  She does smoke weed  Patient was seen one week ago and given tessalon.  No improvement.  She now has a sore throat as well     Past Medical History:  Diagnosis Date  . BV (bacterial vaginosis)    History reviewed. No pertinent family history. Social History   Social History  . Marital status: Single    Spouse name: N/A  . Number of children: N/A  . Years of education: N/A   Occupational History  . Not on file.   Social History Main Topics  . Smoking status: Current Some Day Smoker    Types: Cigarettes  . Smokeless tobacco: Never Used  . Alcohol use Yes  . Drug use: Yes    Types: Marijuana  . Sexual activity: Not on file   Other Topics Concern  . Not on file   Social History Narrative  . No narrative on file   No outpatient prescriptions have been marked as taking for the [facility]   Allergies  Allergen Reactions  . Hepatitis B Virus Vaccine Hives and Swelling    AT INJECTION SITE      ROS: As per HPI, remainder of ROS negative.   OBJECTIVE:   Vitals:   06/22/17 1046  BP: 101/75  Pulse: 92  Resp: 20  Temp: 98.1 F (36.7 C)  TempSrc: Oral  SpO2: 98%     General appearance: alert; no distress Eyes: PERRL; EOMI; conjunctiva normal HENT: normocephalic; atraumatic; TMs normal, canal normal, external ears normal without trauma; nasal mucosa normal; oral mucosa normal Neck: supple Lungs: bilateral faint wheezes Heart: regular rate and rhythm Back: no CVA tenderness Extremities: no cyanosis or edema; symmetrical with no gross deformities Skin: warm and dry Neurologic: normal gait; grossly normal Psychological: alert and cooperative; normal mood and  affect    Labs:  Results for orders placed or performed during the hospital encounter of 07/24/16  Basic metabolic panel  Result Value Ref Range   Sodium 135 135 - 145 mmol/L   Potassium 2.8 (L) 3.5 - 5.1 mmol/L   Chloride 103 101 - 111 mmol/L   CO2 20 (L) 22 - 32 mmol/L   Glucose, Bld 111 (H) 65 - 99 mg/dL   BUN 12 6 - 20 mg/dL   Creatinine, Ser 3.87 (H) 0.44 - 1.00 mg/dL   Calcium 9.3 8.9 - 56.4 mg/dL   GFR calc non Af Amer >60 >60 mL/min   GFR calc Af Amer >60 >60 mL/min   Anion gap 12 5 - 15  CBC  Result Value Ref Range   WBC 10.0 4.0 - 10.5 K/uL   RBC 4.21 3.87 - 5.11 MIL/uL   Hemoglobin 12.5 12.0 - 15.0 g/dL   HCT 33.2 95.1 - 88.4 %   MCV 89.1 78.0 - 100.0 fL   MCH 29.7 26.0 - 34.0 pg   MCHC 33.3 30.0 - 36.0 g/dL   RDW 16.6 06.3 - 01.6 %   Platelets 356 150 - 400 K/uL  I-stat troponin, ED  Result Value Ref Range   Troponin i, poc 0.00 0.00 - 0.08 ng/mL   Comment 3            Labs Reviewed - No  data to display  No results found.     ASSESSMENT & PLAN:  1. Bronchitis     Meds ordered this encounter  Medications  . azithromycin (ZITHROMAX) 250 MG tablet    Sig: Take 1 tablet (250 mg total) by mouth daily. 2 tabs po on day one, then one tablet po once daily on days 2-5.    Dispense:  6 tablet    Refill:  0  . predniSONE (DELTASONE) 20 MG tablet    Sig: Two daily with food    Dispense:  10 tablet    Refill:  0    Reviewed expectations re: course of current medical issues. Questions answered. Outlined signs and symptoms indicating need for more acute intervention. Patient verbalized understanding. After Visit Summary given.    Procedures:      Elvina Sidle, MD 06/22/17 1109

## 2017-06-22 NOTE — Discharge Instructions (Signed)
You have acute bronchitis which should resolve over the next few days with the medicine provided.  Be very careful what you smoke since much of these products now contain dangerous psychoactive poisons.

## 2018-04-27 ENCOUNTER — Emergency Department (HOSPITAL_COMMUNITY): Payer: Self-pay

## 2018-04-27 ENCOUNTER — Encounter (HOSPITAL_COMMUNITY): Payer: Self-pay | Admitting: Emergency Medicine

## 2018-04-27 ENCOUNTER — Emergency Department (HOSPITAL_COMMUNITY)
Admission: EM | Admit: 2018-04-27 | Discharge: 2018-04-27 | Disposition: A | Discharge status: Home / Self Care | Payer: Self-pay | Attending: Emergency Medicine | Admitting: Emergency Medicine

## 2018-04-27 DIAGNOSIS — R079 Chest pain, unspecified: Secondary | ICD-10-CM | POA: Insufficient documentation

## 2018-04-27 DIAGNOSIS — J45909 Unspecified asthma, uncomplicated: Secondary | ICD-10-CM | POA: Insufficient documentation

## 2018-04-27 DIAGNOSIS — F17210 Nicotine dependence, cigarettes, uncomplicated: Secondary | ICD-10-CM | POA: Insufficient documentation

## 2018-04-27 DIAGNOSIS — Z79899 Other long term (current) drug therapy: Secondary | ICD-10-CM | POA: Insufficient documentation

## 2018-04-27 LAB — BASIC METABOLIC PANEL
ANION GAP: 11 (ref 5–15)
BUN: 6 mg/dL (ref 6–20)
CALCIUM: 9.4 mg/dL (ref 8.9–10.3)
CO2: 22 mmol/L (ref 22–32)
Chloride: 105 mmol/L (ref 98–111)
Creatinine, Ser: 1.05 mg/dL — ABNORMAL HIGH (ref 0.44–1.00)
GFR calc Af Amer: >60 mL/min (ref >60)
GLUCOSE: 89 mg/dL (ref 70–99)
Potassium: 3.3 mmol/L — ABNORMAL LOW (ref 3.5–5.1)
SODIUM: 138 mmol/L (ref 135–145)

## 2018-04-27 LAB — I-STAT BETA HCG BLOOD, ED (MC, WL, AP ONLY): I-stat hCG, quantitative: <5 m[IU]/mL (ref <5)

## 2018-04-27 LAB — CBC
HCT: 40.8 % (ref 36.0–46.0)
HEMOGLOBIN: 13.5 g/dL (ref 12.0–15.0)
MCH: 30.1 pg (ref 26.0–34.0)
MCHC: 33.1 g/dL (ref 30.0–36.0)
MCV: 91.1 fL (ref 78.0–100.0)
Platelets: 347 10*3/uL (ref 150–400)
RBC: 4.48 MIL/uL (ref 3.87–5.11)
RDW: 12.4 % (ref 11.5–15.5)
WBC: 9.8 10*3/uL (ref 4.0–10.5)

## 2018-04-27 LAB — I-STAT TROPONIN, ED: TROPONIN I, POC: 0 ng/mL (ref 0.00–0.08)

## 2018-04-27 MED ORDER — ONDANSETRON HCL 4 MG/2ML IJ SOLN
4.0000 mg | Freq: Once | INTRAMUSCULAR | Status: AC
  Administered 2018-04-27: 4 mg via INTRAVENOUS
  Filled 2018-04-27: qty 2

## 2018-04-27 MED ORDER — FAMOTIDINE IN NACL 20-0.9 MG/50ML-% IV SOLN
20.0000 mg | Freq: Once | INTRAVENOUS | Status: AC
  Administered 2018-04-27: 20 mg via INTRAVENOUS
  Filled 2018-04-27: qty 50

## 2018-04-27 MED ORDER — SODIUM CHLORIDE 0.9 % IV BOLUS
1000.0000 mL | Freq: Once | INTRAVENOUS | Status: AC
  Administered 2018-04-27: 1000 mL via INTRAVENOUS

## 2018-04-27 NOTE — ED Notes (Signed)
PA at bedside,  

## 2018-04-27 NOTE — Discharge Instructions (Addendum)
As we discussed, your EKG, chest x-ray and troponin (cardiac marker) looked very good today!  For acid reflux, you may use OTC medicines (Prilosec, Zantac, Pepcid) daily to ensure better coverage. I have included some information for you on medications and lifestyle modifications that you can do to help minimize symptoms.  Good luck with school and your upcoming exams! Be sure to take time for yourself to relax and unwind.

## 2018-04-27 NOTE — ED Triage Notes (Signed)
Pt c/o cp x 1 month, was intermittent now constant. L sided, burning sensation with radiation to L arm. +shob, denies n/v.

## 2018-04-27 NOTE — ED Notes (Signed)
Patient is resting with call bell in reach  

## 2018-04-27 NOTE — ED Provider Notes (Signed)
[facility] EMERGENCY DEPARTMENT Provider Note  CSN: [id number] Arrival date & time: 04/27/18  0440    History   Chief Complaint Chief Complaint  Patient presents with  . Chest Pain    HPI Bonnie Turner is a 32 y.o. female with a medical history of asthma who presented to the ED for chest pain x2 weeks. The patient complains of chest pressure/discomfort. The discomfort is described as sharp, brief with radiation to the left arm. Onset of symptoms was gradual starting 2 weeks ago, unchanged course since that time. However, she states she had similar chest pains when she was in college. Patient unable to quantify how long the episodes last, but states they resolve on their own. The episodes are brought on mostly by lying down . The patient also complains of SOB. The patient denies fever, cough, hemoptysis, abdominal pain, nausea, vomiting, back pain and leg swelling.   Patient's cardiac risk factors are none. The patient denies risk factors of none.  Care prior to arrival consisted of nothing, with complete relief. She states she discussed this with a provider back in 2010 and evaluation consisted of CXR which was normal. She has not sought care for this chest pain until today.    Past Medical History:  Diagnosis Date  . BV (bacterial vaginosis)     There are no active problems to display for this patient.   History reviewed. No pertinent surgical history.   OB History   None      Home Medications    Prior to Admission medications   Medication Sig Start Date End Date Taking? Authorizing Provider  albuterol (PROVENTIL HFA;VENTOLIN HFA) 108 (90 Base) MCG/ACT inhaler Inhale 1-2 puffs into the lungs every 6 (six) hours as needed for wheezing or shortness of breath. 12/18/16  Yes Domenick Gong, MD  COLLAGEN PO Take 1 tablet by mouth daily.   Yes [provider]  POTASSIUM PO Take 1 tablet by mouth daily.   Yes [provider]    Spacer/Aero-Holding Chambers (AEROCHAMBER PLUS) inhaler Use as instructed 12/18/16  Yes Domenick Gong, MD  azithromycin (ZITHROMAX) 250 MG tablet Take 1 tablet (250 mg total) by mouth daily. 2 tabs po on day one, then one tablet po once daily on days 2-5. Patient not taking: Reported on 04/27/2018 06/22/17   Elvina Sidle, MD  ipratropium (ATROVENT) 0.06 % nasal spray Place 2 sprays into both nostrils 4 (four) times daily. Patient not taking: Reported on 04/27/2018 06/15/17   Belinda Fisher, PA-C  predniSONE (DELTASONE) 20 MG tablet Two daily with food Patient not taking: Reported on 04/27/2018 06/22/17   Elvina Sidle, MD    Family History No family history on file.  Social History Social History   Tobacco Use  . Smoking status: Current Some Day Smoker    Types: Cigarettes  . Smokeless tobacco: Never Used  Substance Use Topics  . Alcohol use: Yes  . Drug use: Yes    Types: Marijuana     Allergies   Hepatitis b virus vaccine   Review of Systems Review of Systems  Constitutional: Negative for diaphoresis, fatigue and fever.  Respiratory: Positive for shortness of breath.   Cardiovascular: Positive for chest pain.  Gastrointestinal: Negative for abdominal pain, nausea and vomiting.       Reflux  Neurological: Negative for dizziness, syncope, light-headedness and headaches.  Psychiatric/Behavioral: The patient is nervous/anxious.      Physical Exam Updated Vital Signs BP 97/63 (BP Location: Right  Arm)   Pulse 85   Temp 98.4 F (36.9 C) (Oral)   Resp 20   Ht 5\' 3"  (1.6 m)   Wt 71.2 kg (157 lb)   LMP 04/07/2018 Comment: pt shielded  SpO2 100%   BMI 27.81 kg/m   Physical Exam  Constitutional: She appears well-developed and well-nourished. No distress.  Eyes: Pupils are equal, round, and reactive to light. Conjunctivae, EOM and lids are normal.  Neck: Carotid bruit is not present.  Cardiovascular: Normal rate, regular rhythm, normal heart sounds and intact distal  pulses.  Pulses:      Dorsalis pedis pulses are 2+ on the right side, and 2+ on the left side.       Posterior tibial pulses are 2+ on the right side, and 2+ on the left side.  Pulmonary/Chest: Effort normal and breath sounds normal.  Abdominal: Soft. Normal appearance and bowel sounds are normal. There is no tenderness.  Skin: Skin is warm and intact. Capillary refill takes less than 2 seconds. She is not diaphoretic. No pallor.  Nursing note and vitals reviewed.    ED Treatments / Results  Labs (all labs ordered are listed, but only abnormal results are displayed) Labs Reviewed  BASIC METABOLIC PANEL - Abnormal; Notable for the following components:      Result Value   Potassium 3.3 (*)    Creatinine, Ser 1.05 (*)    All other components within normal limits  CBC  I-STAT TROPONIN, ED  I-STAT BETA HCG BLOOD, ED ([facility], [facility], [facility] ONLY)    EKG None  Radiology Dg Chest 2 View  Result Date: 04/27/2018 CLINICAL DATA:  32 year old female with chest pain. EXAM: CHEST - 2 VIEW COMPARISON:  Chest radiograph dated 12/03/2016 FINDINGS: The heart size and mediastinal contours are within normal limits. Both lungs are clear. The visualized skeletal structures are unremarkable. IMPRESSION: No active cardiopulmonary disease. Electronically Signed   By: Elgie Collard M.D.   On: 04/27/2018 06:59    Procedures Procedures (including critical care time)  Medications Ordered in ED Medications  sodium chloride 0.9 % bolus 1,000 mL (1,000 mLs Intravenous New Bag/Given 04/27/18 0701)  famotidine (PEPCID) IVPB 20 mg premix (0 mg Intravenous Stopped 04/27/18 0751)  ondansetron (ZOFRAN) injection 4 mg (4 mg Intravenous Given 04/27/18 0715)    Initial Impression / Assessment and Plan / ED Course  Triage vital signs and the nursing notes have been reviewed.  Pertinent labs & imaging results that were available during care of the patient were reviewed and considered in medical decision making (see chart  for details).  Patient presents in no acute distress and is well appearing. Initially, reports mild chest pain symptoms, but states that it is resolving. CXR, EKG and troponin are reassuring that this is not an acute cardiac or pulmonary event. HEART score of 0. She has 0 risk factors for DVT/PE. Patient later reports that she has heartburn symptoms. 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 Given nature and intermittent nature of this pain, chest pain likely due to GERD and stress.   Clinical Course as of Apr 27 838  Sat Apr 27, 2018  4132 EKG showed NSR with tachycardia. No ST elevations/depressions or signs of acute ischemia or infarct. This is reassuring in combination with negative troponin which assists in evaluating and ruling out an acute cardiac process. CXR normal.  Tachycardic on EKG and in triage. Will administer IV fluids.   [GM]  [phone]  HR responding appropriately with IV fluids. HR now in the 80s. Patient reports symptom relief with IV Pepcid and Zofran as well.   [GM]    Clinical Course User Index [GM] Turner, Bonnie MedicusGabrielle I, PA-C   Final Clinical Impressions(s) / ED Diagnoses  1. Chest Pain. Likely due to GERD and stress. Acute cardiac or pulm causes have been evaluated and ruled out. Education provided on OTC, supportive and lifestyle modifications that may assist with symptom relief. Education provided on indications that warrant return to the ED. Advised to establish care with PCP for GERD and stress management.  Dispo: Home. After thorough clinical evaluation, this patient is determined to be medically stable and can be safely discharged with the previously mentioned treatment and/or outpatient follow-up/referral(s). At this time, there are no other apparent medical conditions that require further screening, evaluation or treatment.   Final diagnoses:  Nonspecific chest pain    ED Discharge Orders    None        Windy CarinaMortis,  Gabrielle I, [place]PA-C 04/27/18 [id number]    Gilda CreasePollina, Christopher J, MD 04/28/18 [id number]

## 2019-06-07 ENCOUNTER — Encounter (HOSPITAL_COMMUNITY): Payer: Self-pay

## 2019-06-07 ENCOUNTER — Emergency Department (HOSPITAL_COMMUNITY): Payer: Self-pay

## 2019-06-07 ENCOUNTER — Other Ambulatory Visit: Payer: Self-pay

## 2019-06-07 ENCOUNTER — Emergency Department (HOSPITAL_COMMUNITY)
Admission: EM | Admit: 2019-06-07 | Discharge: 2019-06-07 | Disposition: A | Discharge status: Home / Self Care | Payer: Self-pay | Attending: Emergency Medicine | Admitting: Emergency Medicine

## 2019-06-07 DIAGNOSIS — R1011 Right upper quadrant pain: Secondary | ICD-10-CM | POA: Insufficient documentation

## 2019-06-07 DIAGNOSIS — F17210 Nicotine dependence, cigarettes, uncomplicated: Secondary | ICD-10-CM | POA: Insufficient documentation

## 2019-06-07 DIAGNOSIS — R109 Unspecified abdominal pain: Secondary | ICD-10-CM

## 2019-06-07 LAB — CBC WITH DIFFERENTIAL/PLATELET
Abs Immature Granulocytes: 0.03 10*3/uL (ref 0.00–0.07)
Basophils Absolute: 0 10*3/uL (ref 0.0–0.1)
Basophils Relative: 0 %
Eosinophils Absolute: 0.1 10*3/uL (ref 0.0–0.5)
Eosinophils Relative: 1 %
HCT: 40.5 % (ref 36.0–46.0)
Hemoglobin: 13.7 g/dL (ref 12.0–15.0)
Immature Granulocytes: 0 %
Lymphocytes Relative: 24 %
Lymphs Abs: 2.9 10*3/uL (ref 0.7–4.0)
MCH: 31.6 pg (ref 26.0–34.0)
MCHC: 33.8 g/dL (ref 30.0–36.0)
MCV: 93.5 fL (ref 80.0–100.0)
Monocytes Absolute: 0.6 10*3/uL (ref 0.1–1.0)
Monocytes Relative: 5 %
Neutro Abs: 8.2 10*3/uL — ABNORMAL HIGH (ref 1.7–7.7)
Neutrophils Relative %: 70 %
Platelets: 382 10*3/uL (ref 150–400)
RBC: 4.33 MIL/uL (ref 3.87–5.11)
RDW: 12.3 % (ref 11.5–15.5)
WBC: 11.9 10*3/uL — ABNORMAL HIGH (ref 4.0–10.5)
nRBC: 0 % (ref 0.0–0.2)

## 2019-06-07 LAB — URINALYSIS, ROUTINE W REFLEX MICROSCOPIC
Bilirubin Urine: NEGATIVE
Glucose, UA: NEGATIVE mg/dL
Hgb urine dipstick: NEGATIVE
Ketones, ur: 5 mg/dL — AB
Leukocytes,Ua: NEGATIVE
Nitrite: NEGATIVE
Protein, ur: NEGATIVE mg/dL
Specific Gravity, Urine: 1.009 (ref 1.005–1.030)
pH: 6 (ref 5.0–8.0)

## 2019-06-07 LAB — COMPREHENSIVE METABOLIC PANEL WITH GFR
ALT: 14 U/L (ref 0–44)
AST: 17 U/L (ref 15–41)
Albumin: 4.2 g/dL (ref 3.5–5.0)
Alkaline Phosphatase: 48 U/L (ref 38–126)
Anion gap: 10 (ref 5–15)
BUN: 7 mg/dL (ref 6–20)
CO2: 23 mmol/L (ref 22–32)
Calcium: 9 mg/dL (ref 8.9–10.3)
Chloride: 104 mmol/L (ref 98–111)
Creatinine, Ser: 0.95 mg/dL (ref 0.44–1.00)
GFR calc Af Amer: >60 mL/min
GFR calc non Af Amer: >60 mL/min
Glucose, Bld: 95 mg/dL (ref 70–99)
Potassium: 3.7 mmol/L (ref 3.5–5.1)
Sodium: 137 mmol/L (ref 135–145)
Total Bilirubin: 0.4 mg/dL (ref 0.3–1.2)
Total Protein: 6.7 g/dL (ref 6.5–8.1)

## 2019-06-07 MED ORDER — LACTATED RINGERS IV BOLUS
1000.0000 mL | Freq: Once | INTRAVENOUS | Status: AC
  Administered 2019-06-07: 06:00:00 1000 mL via INTRAVENOUS

## 2019-06-07 NOTE — ED Notes (Signed)
Discharge instructions discussed with pt. Pt verbalized understanding with no questions at this time.  

## 2019-06-07 NOTE — ED Triage Notes (Signed)
Pt states that she has been having R sided flank pain for the last few weeks, getting worse denies urinary symptoms. Pt also adds that she has been having panic attacks lately

## 2019-06-07 NOTE — ED Provider Notes (Signed)
Emergency Department Provider Note   I have reviewed the triage vital signs and the nursing notes.   HISTORY  Chief Complaint Flank Pain   HPI Bonnie Turner is a 33 y.o. female who presents to the emergency department today secondary to right flank pain.  Patient states that she has sharp pain they are going on for a couple months now.  States it was less frequent but the last few days she is had it multiple times a day.  Does not seem to be associated eating sometimes she does have some nausea with it.  No vomiting, diarrhea or constipation.  No rashes.  No trauma.   No other associated or modifying symptoms.    Past Medical History:  Diagnosis Date  . BV (bacterial vaginosis)     There are no active problems to display for this patient.   History reviewed. No pertinent surgical history.  Current Outpatient Rx  . Order #: [id number] Class: Normal  . Order #: [id number] Class: Normal  . Order #: [id number] Class: Historical Med  . Order #: [id number] Class: Normal  . Order #: [id number] Class: Historical Med  . Order #: [id number] Class: Normal  . Order #: [id number] Class: Normal    Allergies Hepatitis b virus vaccines  No family history on file.  Social History Social History   Tobacco Use  . Smoking status: Current Some Day Smoker    Types: Cigarettes  . Smokeless tobacco: Never Used  Substance Use Topics  . Alcohol use: Yes  . Drug use: Yes    Types: Marijuana    Review of Systems  All other systems negative except as documented in the HPI. All pertinent positives and negatives as reviewed in the HPI. ____________________________________________   PHYSICAL EXAM:  VITAL SIGNS: ED Triage Vitals  Enc Vitals Group     BP 06/07/19 0420 129/79     Pulse Rate 06/07/19 0420 (!) 112     Resp 06/07/19 0420 16     Temp 06/07/19 0420 98.2 F (36.8 C)     Temp Source 06/07/19 0420 Oral     SpO2 06/07/19 0420 100 %    Constitutional: Alert and oriented.  Well appearing and in no acute distress. Eyes: Conjunctivae are normal. PERRL. EOMI. Head: Atraumatic. Nose: No congestion/rhinnorhea. Mouth/Throat: Mucous membranes are moist.  Oropharynx non-erythematous. Neck: No stridor.  No meningeal signs.   Cardiovascular: tachycardic rate, regular rhythm. Good peripheral circulation. Grossly normal heart sounds.   Respiratory: Normal respiratory effort.  No retractions. Lungs CTAB. Gastrointestinal: Soft and nontender. No distention.  Musculoskeletal: No lower extremity tenderness nor edema. No gross deformities of extremities. Neurologic:  Normal speech and language. No gross focal neurologic deficits are appreciated.  Skin:  Skin is warm, dry and intact. No rash noted.   ____________________________________________   LABS (all labs ordered are listed, but only abnormal results are displayed)  Labs Reviewed  URINALYSIS, ROUTINE W REFLEX MICROSCOPIC - Abnormal; Notable for the following components:      Result Value   Ketones, ur 5 (*)    All other components within normal limits  CBC WITH DIFFERENTIAL/PLATELET - Abnormal; Notable for the following components:   WBC 11.9 (*)    Neutro Abs 8.2 (*)    All other components within normal limits  COMPREHENSIVE METABOLIC PANEL   ____________________________________________  EKG   EKG Interpretation  Date/Time:  Saturday June 07 2019 06:10:16 EDT Ventricular Rate:  107 PR Interval:    QRS Duration: 93 QT Interval:  344 QTC Calculation:  459 R Axis:   87 Text Interpretation:  Sinus tachycardia Low voltage, precordial leads Borderline T abnormalities, anterior leads No significant change since last tracing Confirmed by Merrily Pew [phone]) on 06/07/2019 6:19:03 AM       ____________________________________________  RADIOLOGY  Dg Chest 2 View  Result Date: 06/07/2019 CLINICAL DATA:  Right-sided flank pain EXAM: CHEST - 2 VIEW COMPARISON:  04/27/2018 FINDINGS: The heart size and  mediastinal contours are within normal limits. Both lungs are clear. The visualized skeletal structures are unremarkable. IMPRESSION: No active cardiopulmonary disease. Electronically Signed   By: Ulyses Jarred M.D.   On: 06/07/2019 05:53   US Abdomen Limited Ruq  Result Date: 06/07/2019 CLINICAL DATA:  Abdominal pain EXAM: ULTRASOUND ABDOMEN LIMITED RIGHT UPPER QUADRANT COMPARISON:  None. FINDINGS: Gallbladder: No gallstones or wall thickening visualized. No sonographic Murphy sign noted by sonographer. Common bile duct: Diameter: 4 Liver: No focal lesion identified. Within normal limits in parenchymal echogenicity. Portal vein is patent on color Doppler imaging with normal direction of blood flow towards the liver. Other: None. IMPRESSION: Normal right upper quadrant ultrasound. Electronically Signed   By: Ulyses Jarred M.D.   On: 06/07/2019 06:17    ____________________________________________   PROCEDURES  Procedure(s) performed:   Procedures   ____________________________________________   INITIAL IMPRESSION / ASSESSMENT AND PLAN / ED COURSE  Work-up for gallbladder disease was unremarkable.  Symptoms are not currently present.  Sound very atypical for PE or cardiac causes.  No history of kidney stones or evidence of pyelonephritis on her urinalysis.  At this time will discharge to follow-up with PCP for further work-up and management as needed.  Pertinent labs & imaging results that were available during my care of the patient were reviewed by me and considered in my medical decision making (see chart for details).  A medical screening exam was performed and I feel the patient has had an appropriate workup for their chief complaint at this time and likelihood of emergent condition existing is low. They have been counseled on decision, discharge, follow up and which symptoms necessitate immediate return to the emergency department. They or their family verbally stated understanding and  agreement with plan and discharged in stable condition.   ____________________________________________  FINAL CLINICAL IMPRESSION(S) / ED DIAGNOSES  Final diagnoses:  Abdominal pain     MEDICATIONS GIVEN DURING THIS VISIT:  Medications  lactated ringers bolus 1,000 mL (1,000 mLs Intravenous New Bag/Given 06/07/19 0606)     NEW OUTPATIENT MEDICATIONS STARTED DURING THIS VISIT:  New Prescriptions   No medications on file    Note:  This note was prepared with assistance of Dragon voice recognition software. Occasional wrong-word or sound-a-like substitutions may have occurred due to the inherent limitations of voice recognition software.   Gilda Abboud, Corene Cornea, MD 06/07/19 [phone]

## 2019-06-07 NOTE — ED Notes (Signed)
Patient transported to X-ray 

## 2019-06-16 ENCOUNTER — Other Ambulatory Visit: Payer: Self-pay

## 2019-06-16 ENCOUNTER — Emergency Department (HOSPITAL_COMMUNITY): Payer: Self-pay

## 2019-06-16 ENCOUNTER — Encounter (HOSPITAL_COMMUNITY): Payer: Self-pay | Admitting: Emergency Medicine

## 2019-06-16 ENCOUNTER — Emergency Department (HOSPITAL_COMMUNITY)
Admission: EM | Admit: 2019-06-16 | Discharge: 2019-06-16 | Disposition: A | Discharge status: Home / Self Care | Payer: Self-pay | Attending: Emergency Medicine | Admitting: Emergency Medicine

## 2019-06-16 DIAGNOSIS — R51 Headache: Secondary | ICD-10-CM | POA: Insufficient documentation

## 2019-06-16 DIAGNOSIS — R002 Palpitations: Secondary | ICD-10-CM

## 2019-06-16 DIAGNOSIS — R4589 Other symptoms and signs involving emotional state: Secondary | ICD-10-CM

## 2019-06-16 DIAGNOSIS — R519 Headache, unspecified: Secondary | ICD-10-CM

## 2019-06-16 DIAGNOSIS — G4709 Other insomnia: Secondary | ICD-10-CM

## 2019-06-16 DIAGNOSIS — F17210 Nicotine dependence, cigarettes, uncomplicated: Secondary | ICD-10-CM | POA: Insufficient documentation

## 2019-06-16 LAB — COMPREHENSIVE METABOLIC PANEL
ALT: 13 U/L (ref 0–44)
AST: 16 U/L (ref 15–41)
Albumin: 4.7 g/dL (ref 3.5–5.0)
Alkaline Phosphatase: 51 U/L (ref 38–126)
Anion gap: 9 (ref 5–15)
BUN: 7 mg/dL (ref 6–20)
CO2: 23 mmol/L (ref 22–32)
Calcium: 9.1 mg/dL (ref 8.9–10.3)
Chloride: 106 mmol/L (ref 98–111)
Creatinine, Ser: 0.91 mg/dL (ref 0.44–1.00)
GFR calc Af Amer: >60 mL/min (ref >60)
GFR calc non Af Amer: >60 mL/min (ref >60)
Glucose, Bld: 96 mg/dL (ref 70–99)
Potassium: 3.5 mmol/L (ref 3.5–5.1)
Sodium: 138 mmol/L (ref 135–145)
Total Bilirubin: 0.6 mg/dL (ref 0.3–1.2)
Total Protein: 7.8 g/dL (ref 6.5–8.1)

## 2019-06-16 LAB — T4, FREE: Free T4: 0.96 ng/dL (ref 0.61–1.12)

## 2019-06-16 LAB — CBC
HCT: 41.7 % (ref 36.0–46.0)
Hemoglobin: 14 g/dL (ref 12.0–15.0)
MCH: 31.5 pg (ref 26.0–34.0)
MCHC: 33.6 g/dL (ref 30.0–36.0)
MCV: 93.9 fL (ref 80.0–100.0)
Platelets: 329 10*3/uL (ref 150–400)
RBC: 4.44 MIL/uL (ref 3.87–5.11)
RDW: 12.6 % (ref 11.5–15.5)
WBC: 9 10*3/uL (ref 4.0–10.5)
nRBC: 0 % (ref 0.0–0.2)

## 2019-06-16 LAB — TSH: TSH: 3.225 u[IU]/mL (ref 0.350–4.500)

## 2019-06-16 LAB — I-STAT BETA HCG BLOOD, ED (MC, WL, AP ONLY): I-stat hCG, quantitative: <5 m[IU]/mL (ref <5)

## 2019-06-16 MED ORDER — HYDROXYZINE HCL 25 MG PO TABS: 25.0000 mg | ORAL_TABLET | Freq: Three times a day (TID) | ORAL | 0 refills | Status: DC | PRN

## 2019-06-16 NOTE — ED Provider Notes (Signed)
[facility] DEPT Provider Note   CSN: [id number] Arrival date & time: 06/16/19  1213     History   Chief Complaint Chief Complaint  Patient presents with  . Headache    HPI JETAUN COLBATH is a 33 y.o. female.     Patient c/o feeling out of sorts for the past 2-3 weeks. Symptoms acute onset, episodic, recurrent. Occurs randomly at rest. Duration is variable, minutes to all day. States at times feels as if heart racing. No syncope. Intermittent diffuse headaches, dull, moderate, without specific exacerbating or alleviating factors. Also c/o intermittent mood swings, and feelings of anxiety, with trouble sleeping at night. Denies new/acute stressors, denies depression. Pt requests lab panel including thyroid tests. No heat intolerance, sweats, or acute weight change. No nvd. No dysuria or gu c/o. LNMP 1 month ago.   The history is provided by the patient.  Headache Associated symptoms: no abdominal pain, no back pain, no cough, no diarrhea, no eye pain, no fever, no neck pain, no sore throat and no vomiting     Past Medical History:  Diagnosis Date  . BV (bacterial vaginosis)     There are no active problems to display for this patient.   History reviewed. No pertinent surgical history.   OB History   No obstetric history on file.      Home Medications    Prior to Admission medications   Medication Sig Start Date End Date Taking? Authorizing Provider  albuterol (PROVENTIL HFA;VENTOLIN HFA) 108 (90 Base) MCG/ACT inhaler Inhale 1-2 puffs into the lungs every 6 (six) hours as needed for wheezing or shortness of breath. 12/18/16   Melynda Ripple, MD  COLLAGEN PO Take 1 tablet by mouth daily.    [provider]  POTASSIUM PO Take 1 tablet by mouth daily.    [provider]  Spacer/Aero-Holding Chambers (AEROCHAMBER PLUS) inhaler Use as instructed 12/18/16   Melynda Ripple, MD  ipratropium (ATROVENT) 0.06 % nasal spray  Place 2 sprays into both nostrils 4 (four) times daily. Patient not taking: Reported on 04/27/2018 06/15/17 06/07/19  Arturo Morton    Family History No family history on file.  Social History Social History   Tobacco Use  . Smoking status: Current Some Day Smoker    Types: Cigarettes  . Smokeless tobacco: Never Used  Substance Use Topics  . Alcohol use: Yes  . Drug use: Yes    Types: Marijuana     Allergies   Hepatitis b virus vaccines   Review of Systems Review of Systems  Constitutional: Negative for fever.  HENT: Negative for sore throat.   Eyes: Negative for pain and visual disturbance.  Respiratory: Negative for cough and shortness of breath.   Cardiovascular: Negative for chest pain and leg swelling.  Gastrointestinal: Negative for abdominal pain, blood in stool, diarrhea and vomiting.  Endocrine: Negative for polyuria.  Genitourinary: Negative for dysuria, flank pain, vaginal bleeding and vaginal discharge.  Musculoskeletal: Negative for back pain and neck pain.  Skin: Negative for rash.  Neurological: Positive for headaches.  Hematological: Does not bruise/bleed easily.  Psychiatric/Behavioral: Negative for confusion. The patient is nervous/anxious.      Physical Exam Updated Vital Signs BP (!) 118/104 (BP Location: Left Arm)   Pulse (!) 115   Temp 98.5 F (36.9 C) (Oral)   Resp 18   Ht 1.6 m (5\' 3" )   Wt 72.6 kg   LMP 05/21/2019   SpO2 97%   BMI  28.34 kg/m   Physical Exam Vitals signs and nursing note reviewed.  Constitutional:      General: She is not in acute distress.    Appearance: Normal appearance. She is well-developed. She is not diaphoretic.  HENT:     Head: Atraumatic.     Nose: Nose normal.     Mouth/Throat:     Mouth: Mucous membranes are moist.  Eyes:     General: No scleral icterus.    Extraocular Movements: Extraocular movements intact.     Conjunctiva/sclera: Conjunctivae normal.     Pupils: Pupils are equal, round, and  reactive to light.  Neck:     Musculoskeletal: Normal range of motion and neck supple. No neck rigidity or muscular tenderness.     Thyroid: No thyromegaly.     Vascular: No carotid bruit.     Trachea: No tracheal deviation.     Comments: No stiffness or rigidity. Thyroid not grossly enlarged or tender.  Cardiovascular:     Rate and Rhythm: Normal rate and regular rhythm.     Pulses: Normal pulses.     Heart sounds: Normal heart sounds. No murmur. No friction rub. No gallop.   Pulmonary:     Effort: Pulmonary effort is normal. No respiratory distress.     Breath sounds: Normal breath sounds.  Abdominal:     General: Bowel sounds are normal. There is no distension.     Palpations: Abdomen is soft.     Tenderness: There is no abdominal tenderness. There is no guarding.  Genitourinary:    Comments: No cva tenderness.  Musculoskeletal: Normal range of motion.        General: No swelling or tenderness.     Right lower leg: No edema.     Left lower leg: No edema.  Lymphadenopathy:     Cervical: No cervical adenopathy.  Skin:    General: Skin is warm and dry.     Findings: No rash.  Neurological:     Mental Status: She is alert and oriented to person, place, and time.     Cranial Nerves: No cranial nerve deficit.     Comments: Alert, speech normal. Motor intact bil, stre 5/5. sens grossly intact. Steady gait.   Psychiatric:     Comments: Mildly anxious.       ED Treatments / Results  Labs (all labs ordered are listed, but only abnormal results are displayed) Results for orders placed or performed during the hospital encounter of 06/16/19  CBC  Result Value Ref Range   WBC 9.0 4.0 - 10.5 K/uL   RBC 4.44 3.87 - 5.11 MIL/uL   Hemoglobin 14.0 12.0 - 15.0 g/dL   HCT 65.741.7 84.636.0 - 96.246.0 %   MCV 93.9 80.0 - 100.0 fL   MCH 31.5 26.0 - 34.0 pg   MCHC 33.6 30.0 - 36.0 g/dL   RDW 95.212.6 84.111.5 - 32.415.5 %   Platelets 329 150 - 400 K/uL   nRBC 0.0 0.0 - 0.2 %  Comprehensive metabolic panel   Result Value Ref Range   Sodium 138 135 - 145 mmol/L   Potassium 3.5 3.5 - 5.1 mmol/L   Chloride 106 98 - 111 mmol/L   CO2 23 22 - 32 mmol/L   Glucose, Bld 96 70 - 99 mg/dL   BUN 7 6 - 20 mg/dL   Creatinine, Ser 4.010.91 0.44 - 1.00 mg/dL   Calcium 9.1 8.9 - 02.710.3 mg/dL   Total Protein 7.8 6.5 - 8.1 g/dL  Albumin 4.7 3.5 - 5.0 g/dL   AST 16 15 - 41 U/L   ALT 13 0 - 44 U/L   Alkaline Phosphatase 51 38 - 126 U/L   Total Bilirubin 0.6 0.3 - 1.2 mg/dL   GFR calc non Af Amer >60 >60 mL/min   GFR calc Af Amer >60 >60 mL/min   Anion gap 9 5 - 15  TSH  Result Value Ref Range   TSH 3.225 0.350 - 4.500 uIU/mL  I-Stat Beta hCG blood, ED ([facility], [facility], [facility] only)  Result Value Ref Range   I-stat hCG, quantitative <5.0 <5 mIU/mL   Comment 3           Dg Chest 2 View  Result Date: 06/07/2019 CLINICAL DATA:  Right-sided flank pain EXAM: CHEST - 2 VIEW COMPARISON:  04/27/2018 FINDINGS: The heart size and mediastinal contours are within normal limits. Both lungs are clear. The visualized skeletal structures are unremarkable. IMPRESSION: No active cardiopulmonary disease. Electronically Signed   By: Deatra RobinsonKevin  Herman M.D.   On: 06/07/2019 05:53   Ct Head Wo Contrast  Result Date: 06/16/2019 CLINICAL DATA:  Altered level of consciousness. EXAM: CT HEAD WITHOUT CONTRAST TECHNIQUE: Contiguous axial images were obtained from the base of the skull through the vertex without intravenous contrast. COMPARISON:  March 15, 2009 FINDINGS: Brain: No evidence of acute infarction, hemorrhage, hydrocephalus, extra-axial collection or mass lesion/mass effect. Vascular: No hyperdense vessel or unexpected calcification. Skull: Normal. Negative for fracture or focal lesion. Sinuses/Orbits: No acute finding. Other: None. IMPRESSION: No acute intracranial abnormality. Electronically Signed   By: Ted Mcalpineobrinka  Dimitrova M.D.   On: 06/16/2019 13:38   [place] Abdomen Limited Ruq  Result Date: 06/07/2019 CLINICAL DATA:  Abdominal pain EXAM:  ULTRASOUND ABDOMEN LIMITED RIGHT UPPER QUADRANT COMPARISON:  None. FINDINGS: Gallbladder: No gallstones or wall thickening visualized. No sonographic Murphy sign noted by sonographer. Common bile duct: Diameter: 4 Liver: No focal lesion identified. Within normal limits in parenchymal echogenicity. Portal vein is patent on color Doppler imaging with normal direction of blood flow towards the liver. Other: None. IMPRESSION: Normal right upper quadrant ultrasound. Electronically Signed   By: Deatra RobinsonKevin  Herman M.D.   On: 06/07/2019 06:17    EKG EKG Interpretation  Date/Time:  Monday June 16 2019 12:48:46 EDT Ventricular Rate:  100 PR Interval:    QRS Duration: 95 QT Interval:  357 QTC Calculation: 461 R Axis:   90 Text Interpretation:  Sinus tachycardia Low voltage, precordial leads Baseline wander in lead(s) V5 No significant change since last tracing Confirmed by Cathren LaineSteinl, Shakiyah Cirilo ([id number]) on 06/16/2019 12:51:47 PM   Radiology Ct Head Wo Contrast  Result Date: 06/16/2019 CLINICAL DATA:  Altered level of consciousness. EXAM: CT HEAD WITHOUT CONTRAST TECHNIQUE: Contiguous axial images were obtained from the base of the skull through the vertex without intravenous contrast. COMPARISON:  March 15, 2009 FINDINGS: Brain: No evidence of acute infarction, hemorrhage, hydrocephalus, extra-axial collection or mass lesion/mass effect. Vascular: No hyperdense vessel or unexpected calcification. Skull: Normal. Negative for fracture or focal lesion. Sinuses/Orbits: No acute finding. Other: None. IMPRESSION: No acute intracranial abnormality. Electronically Signed   By: Ted Mcalpineobrinka  Dimitrova M.D.   On: 06/16/2019 13:38    Procedures Procedures (including critical care time)  Medications Ordered in ED Medications - No data to display   Initial Impression / Assessment and Plan / ED Course  I have reviewed the triage vital signs and the nursing notes.  Pertinent labs & imaging results that were available during my  care of the patient were reviewed by me and considered in my medical decision making (see chart for details).  Labs sent. Ecg. Imaging ordered.   Reviewed nursing notes and prior charts for additional history.   Labs reviewed by me - tsh normal. hgb normal.   CT reviewed by me - no acute hem.   Acetaminophen po. Po fluids.  Recheck vitals normal.  rx vistaril for anxiety prn.  Rec outpt pcp f/u.  Return precautions provided.    Final Clinical Impressions(s) / ED Diagnoses   Final diagnoses:  None    ED Discharge Orders    None       Cathren Laine, MD 06/16/19 1407

## 2019-06-16 NOTE — ED Triage Notes (Signed)
Patient reports headaches, mood swings, body aches, and increased anxiety x2 weeks. Denies cough and fever. Denies new stressors.

## 2019-06-16 NOTE — Discharge Instructions (Addendum)
It was our pleasure to provide your ER care today - we hope that you feel better.  Your lab work/thyroids tests look good or normal.  If anxiety, you may try taking vistaril as need - may make drowsy, no driving when taking.  Follow up with primary care doctor in the coming week.   Return to ER if worse, new symptoms, fevers, new or severe pain, trouble breathing, persistent fast heart beat, or other concern.

## 2020-07-09 ENCOUNTER — Other Ambulatory Visit: Payer: Self-pay

## 2020-07-09 ENCOUNTER — Ambulatory Visit (HOSPITAL_COMMUNITY)
Admission: EM | Admit: 2020-07-09 | Discharge: 2020-07-09 | Disposition: A | Discharge status: Home / Self Care | Payer: No Payment, Other | Attending: Psychiatry | Admitting: Psychiatry

## 2020-07-09 DIAGNOSIS — F4310 Post-traumatic stress disorder, unspecified: Secondary | ICD-10-CM | POA: Insufficient documentation

## 2020-07-09 DIAGNOSIS — R441 Visual hallucinations: Secondary | ICD-10-CM | POA: Insufficient documentation

## 2020-07-09 DIAGNOSIS — Z9114 Patient's other noncompliance with medication regimen: Secondary | ICD-10-CM | POA: Insufficient documentation

## 2020-07-09 NOTE — ED Notes (Signed)
Locker #[id number]; [facility].
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
## 2020-07-09 NOTE — Discharge Instructions (Addendum)
You are encouraged to present to [facility] outpatient clinic(upstairs) on Monday at 7:50 for walk in appointment.  Walk in hours are from 8-11:30 Monday-Thursday.  They will schedule follow up appointment during this initial visit.   [address], [place] [phone]  Take all of your medications as prescribed by your mental healthcare provider.  Report any adverse effects and reactions from your medications to your outpatient provider promptly.  Do not engage in alcohol and or illegal drug use while on prescription medicines. Keep all scheduled appointments. This is to ensure that you are getting refills on time and to avoid any interruption in your medication.  If you are unable to keep an appointment call to reschedule.  Be sure to follow up with resources and follow ups given. In the event of worsening symptoms call the crisis hotline, 911, and or go to the nearest emergency department for appropriate evaluation and treatment of symptoms. Follow-up with your primary care provider for your medical issues, concerns and or health care needs.

## 2020-07-28 ENCOUNTER — Ambulatory Visit (INDEPENDENT_AMBULATORY_CARE_PROVIDER_SITE_OTHER): Payer: No Payment, Other | Admitting: Psychiatry

## 2020-07-28 ENCOUNTER — Other Ambulatory Visit: Payer: Self-pay

## 2020-07-28 ENCOUNTER — Encounter (HOSPITAL_COMMUNITY): Payer: Self-pay | Admitting: Psychiatry

## 2020-07-28 VITALS — Ht 63.0 in | Wt 181.0 lb

## 2020-07-28 DIAGNOSIS — F4310 Post-traumatic stress disorder, unspecified: Secondary | ICD-10-CM | POA: Insufficient documentation

## 2020-07-28 DIAGNOSIS — F330 Major depressive disorder, recurrent, mild: Secondary | ICD-10-CM | POA: Insufficient documentation

## 2020-07-28 MED ORDER — TRAZODONE HCL 50 MG PO TABS: 50.0000 mg | ORAL_TABLET | Freq: Every evening | ORAL | 1 refills | Status: DC | PRN

## 2020-07-28 MED ORDER — FLUVOXAMINE MALEATE 50 MG PO TABS: 50.0000 mg | ORAL_TABLET | Freq: Every day | ORAL | 1 refills | Status: DC
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
## 2020-09-06 ENCOUNTER — Other Ambulatory Visit: Payer: Self-pay

## 2020-09-06 ENCOUNTER — Encounter (HOSPITAL_COMMUNITY): Payer: Self-pay | Admitting: Psychiatry

## 2020-09-06 ENCOUNTER — Ambulatory Visit (INDEPENDENT_AMBULATORY_CARE_PROVIDER_SITE_OTHER): Payer: No Payment, Other | Admitting: Psychiatry

## 2020-09-06 VITALS — BP 98/71 | HR 92 | Ht 63.0 in | Wt 185.0 lb

## 2020-09-06 DIAGNOSIS — F4310 Post-traumatic stress disorder, unspecified: Secondary | ICD-10-CM | POA: Diagnosis not present

## 2020-09-06 DIAGNOSIS — F3342 Major depressive disorder, recurrent, in full remission: Secondary | ICD-10-CM | POA: Diagnosis not present

## 2020-09-06 MED ORDER — BUSPIRONE HCL 10 MG PO TABS: 10.0000 mg | ORAL_TABLET | Freq: Two times a day (BID) | ORAL | 1 refills | Status: DC

## 2020-09-06 MED ORDER — FLUVOXAMINE MALEATE 50 MG PO TABS: 50.0000 mg | ORAL_TABLET | Freq: Every day | ORAL | 1 refills | Status: DC

## 2020-09-06 MED ORDER — TRAZODONE HCL 50 MG PO TABS: 50.0000 mg | ORAL_TABLET | Freq: Every evening | ORAL | 1 refills | Status: DC | PRN

## 2020-09-06 NOTE — Progress Notes (Signed)
BH OP Progress Note   Patient Identification: Bonnie Turner MRN:  [id number] Date of Evaluation:  09/06/2020   Chief Complaint:  " I am not having panic attacks any more but I feel a bit loopy at times and have derealization."   Visit Diagnosis:    ICD-10-CM   1. PTSD (post-traumatic stress disorder)  F43.10 fluvoxaMINE (LUVOX) 50 MG tablet    traZODone (DESYREL) 50 MG tablet  2. Recurrent major depressive disorder, in full remission (HCC)  F33.42 fluvoxaMINE (LUVOX) 50 MG tablet    traZODone (DESYREL) 50 MG tablet    busPIRone (BUSPAR) 10 MG tablet    History of Present Illness: Patient reported that after restarting fluvoxamine she noticed that her panic attacks anxiety improved significantly.  She feels really good about that. However she has noticed that she feels a little loopy and also feels like she is having more derealization during the day now.  She stated that she feels anxious and nervous about a car hitting her car when she is riding the car with her father.  The other day she had an intrusive thought of someone trying to poison her even when she had not consumed any food from outside. She stated that lately she has noticed that she has tingling sensations in her frontal part of the head before she has these intrusive thoughts overtake her. She stated that her aunt had come to live with the family for a few days and her aunt has significant anxiety issues.  She stated that being on her made her feel very anxious and uncomfortable and she started feeling very panicky once again.  She decided to restart taking her mirtazapine about 4 days ago when she feels that has been helpful to some extent but she does not want to continue it for long-term.  She stated that when she took it has been regularly in the past she had significant acne breakout and she does not want that again. She also mentioned that she has PCOS and recently her birth control pill was changed to a different  brand.  She has not restarted seeing her therapist yet.  Overall she feels she is in a better position but still has some anxiety.  Writer recommended a trial of buspirone to which she replied that she has tried it in the past for a few months and she did not find it to be very beneficial.  However since she does not want to continue taking the mirtazapine she is open to retrying it again.  Writer informed her that the combination of fluvoxamine and buspirone can help her anxiety. She was agreeable to this suggestion.  Her sleep is improved with the help of trazodone.  She is starting to make plans for her future.  Past Psychiatric History: Has been seen in [place] in the past, has had a few visits to the ED last year after the traumatic event for (command type auditory hallucinations) and suicidal thoughts  Previous Psychotropic Medications: Yes -Luvox, Mirtazepine, Buspar, Hydroxyzine, Zoloft (suspected allergy), Seroquel, Prazosin  Substance Abuse History in the last 12 months:  Yes.  Marijuana use 3 months ago.  Consequences of Substance Abuse: NA  Past Medical History:  Past Medical History:  Diagnosis Date  . BV (bacterial vaginosis)    No past surgical history on file.  Family Psychiatric History: Unknown diagnoses but reports dad is on medications for psychosis in the past and mother has paranoia and anxiety symptoms.  Family History: No family  history on file.  Social History:   Social History   Socioeconomic History  . Marital status: Single    Spouse name: Not on file  . Number of children: Not on file  . Years of education: Not on file  . Highest education level: Not on file  Occupational History  . Not on file  Tobacco Use  . Smoking status: Current Some Day Smoker    Types: Cigarettes  . Smokeless tobacco: Never Used  Substance and Sexual Activity  . Alcohol use: Yes  . Drug use: Yes    Types: Marijuana  . Sexual activity: Not on file  Other Topics  Concern  . Not on file  Social History Narrative  . Not on file   Social Determinants of Health   Financial Resource Strain:   . Difficulty of Paying Living Expenses: Not on file  Food Insecurity:   . Worried About Programme researcher, broadcasting/film/video in the Last Year: Not on file  . Ran Out of Food in the Last Year: Not on file  Transportation Needs:   . Lack of Transportation (Medical): Not on file  . Lack of Transportation (Non-Medical): Not on file  Physical Activity:   . Days of Exercise per Week: Not on file  . Minutes of Exercise per Session: Not on file  Stress:   . Feeling of Stress : Not on file  Social Connections:   . Frequency of Communication with Friends and Family: Not on file  . Frequency of Social Gatherings with Friends and Family: Not on file  . Attends Religious Services: Not on file  . Active Member of Clubs or Organizations: Not on file  . Attends Banker Meetings: Not on file  . Marital Status: Not on file    Additional Social History: Currently living with her parents, has double majors in criminal justice, aspires to be a Clinical research associate.  Was working in the past, has not held a job for about 2 years now.  Allergies:   Allergies  Allergen Reactions  . Hepatitis B Virus Vaccines Hives and Swelling    AT INJECTION SITE    Metabolic Disorder Labs: No results found for: HGBA1C, MPG No results found for: PROLACTIN No results found for: CHOL, TRIG, HDL, CHOLHDL, VLDL, LDLCALC Lab Results  Component Value Date   TSH 3.225 06/16/2019    Therapeutic Level Labs: No results found for: LITHIUM No results found for: CBMZ No results found for: VALPROATE  Current Medications: Current Outpatient Medications  Medication Sig Dispense Refill  . fluvoxaMINE (LUVOX) 50 MG tablet Take 1 tablet (50 mg total) by mouth at bedtime. 30 tablet 1  . traZODone (DESYREL) 50 MG tablet Take 1 tablet (50 mg total) by mouth at bedtime as needed for sleep. 30 tablet 1  . busPIRone  (BUSPAR) 10 MG tablet Take 1 tablet (10 mg total) by mouth 2 (two) times daily. 60 tablet 1   No current facility-administered medications for this visit.    Musculoskeletal: Strength & Muscle Tone: within normal limits Gait & Station: normal Patient leans: N/A  Psychiatric Specialty Exam: Review of Systems  Blood pressure 98/71, pulse 92, height 5\' 3"  (1.6 m), weight 185 lb (83.9 kg), SpO2 98 %.Body mass index is 32.77 kg/m.  General Appearance: Fairly Groomed  Eye Contact:  Good  Speech:  Clear and Coherent and Normal Rate  Volume:  Normal  Mood:  Euthymic  Affect:  Congruent  Thought Process:  Goal Directed and Descriptions of  Associations: Intact  Orientation:  Full (Time, Place, and Person)  Thought Content:  Logical  Suicidal Thoughts:  No  Homicidal Thoughts:  No  Memory:  Immediate;   Good Recent;   Good  Judgement:  Fair  Insight:  Fair  Psychomotor Activity:  Normal  Concentration:  Concentration: Good and Attention Span: Good  Recall:  Good  Fund of Knowledge:Good  Language: Good  Akathisia:  Negative  Handed:  Right  AIMS (if indicated):  Not done  Assets:  Communication Skills Desire for Improvement Financial Resources/Insurance Housing Social Support Transportation Vocational/Educational  ADL's:  Intact  Cognition: WNL  Sleep:  Good    Assessment and Plan: Patient reported restarting fluvoxamine has been helpful however she still feeling a little anxious in some days and is agreeable to retrying buspirone for that. Potential side effects of medication and risks vs benefits of treatment vs non-treatment were explained and discussed. All questions were answered.   1. PTSD (post-traumatic stress disorder)  - fluvoxaMINE (LUVOX) 50 MG tablet; Take 1 tablet (50 mg total) by mouth at bedtime.  Dispense: 30 tablet; Refill: 1 - traZODone (DESYREL) 50 MG tablet; Take 1 tablet (50 mg total) by mouth at bedtime as needed for sleep.  Dispense: 30 tablet;  Refill: 1  2. Recurrent major depressive disorder, in full remission (HCC)  - fluvoxaMINE (LUVOX) 50 MG tablet; Take 1 tablet (50 mg total) by mouth at bedtime.  Dispense: 30 tablet; Refill: 1 - traZODone (DESYREL) 50 MG tablet; Take 1 tablet (50 mg total) by mouth at bedtime as needed for sleep.  Dispense: 30 tablet; Refill: 1 - Start busPIRone (BUSPAR) 10 MG tablet; Take 1 tablet (10 mg total) by mouth 2 (two) times daily.  Dispense: 60 tablet; Refill: 1   F/up in 2 months. Considering to restart therapy with Ms. Marylu Lund.  Zena Amos, MD 11/29/202111:51 AM

## 2020-11-08 ENCOUNTER — Ambulatory Visit (HOSPITAL_COMMUNITY): Payer: No Payment, Other | Admitting: Psychiatry

## 2020-12-06 ENCOUNTER — Encounter (HOSPITAL_COMMUNITY): Payer: Self-pay | Admitting: Psychiatry

## 2020-12-06 ENCOUNTER — Other Ambulatory Visit: Payer: Self-pay

## 2020-12-06 ENCOUNTER — Ambulatory Visit (INDEPENDENT_AMBULATORY_CARE_PROVIDER_SITE_OTHER): Payer: No Payment, Other | Admitting: Psychiatry

## 2020-12-06 DIAGNOSIS — F422 Mixed obsessional thoughts and acts: Secondary | ICD-10-CM | POA: Diagnosis not present

## 2020-12-06 DIAGNOSIS — F4310 Post-traumatic stress disorder, unspecified: Secondary | ICD-10-CM | POA: Diagnosis not present

## 2020-12-06 DIAGNOSIS — F3342 Major depressive disorder, recurrent, in full remission: Secondary | ICD-10-CM | POA: Diagnosis not present

## 2020-12-06 MED ORDER — FLUOXETINE HCL 40 MG PO CAPS: 40.0000 mg | ORAL_CAPSULE | Freq: Every day | ORAL | 1 refills | Status: DC

## 2020-12-06 MED ORDER — TRAZODONE HCL 50 MG PO TABS: 50.0000 mg | ORAL_TABLET | Freq: Every evening | ORAL | 1 refills | Status: DC | PRN

## 2020-12-06 NOTE — Progress Notes (Signed)
BH OP Progress Note   Patient Identification: Bonnie Turner MRN:  [id number] Date of Evaluation:  12/06/2020   Chief Complaint:  " I have been having these gruesome thoughts."  Visit Diagnosis:    ICD-10-CM   1. Mixed obsessional thoughts and acts  F42.2 FLUoxetine (PROZAC) 40 MG capsule  2. PTSD (post-traumatic stress disorder)  F43.10 traZODone (DESYREL) 50 MG tablet  3. Recurrent major depressive disorder, in full remission (HCC)  F33.42 traZODone (DESYREL) 50 MG tablet    History of Present Illness: Patient reported that for the past 3 to 4 weeks she started having very severe headaches that did start soon she will wake up in the morning.  She stated that those headaches were impacting her ability to function during the day.  She eventually tried stopping the fluvoxamine and she noticed that the headaches improved.  A few days later she saw her PCP and the PCP recommended switching her to a different antidepressant.  She was prescribed Prozac.  She stated that she took Prozac 10 mg for 1 week and then has been taking the 20 mg dose for the past 1 week.  She stated that she is no longer having any headaches.  However she has noticed that she started to have significant to some intrusive thoughts that keep occupying her mind.  She stated that she keeps thinking about that and how people will die a very painful that.  She gave an example of how her brother asked to borrow a car and she had intrusive thoughts of someone blowing up his head while he was driving.  She stated that she is seeing all these blood related intrusive scenes in front of her eyes she is not sure what the source is.  She stated that she was planning to return back to college classes this year however she decided not to because sometimes her mood is up-and-down.  She stated that she was prescribed mirtazapine in the past and she asked if she can be put back on that to see if that would help her with these intrusive  thoughts.  She stated that mirtazapine was discontinued because she was having significant acne issues but that could have been due to PCOS.  Writer recommended that instead of adding mirtazapine to her regimen and worrying about the possible side effect of weight gain we can maximize the dose of Prozac to see if that would help with these intrusive thoughts.  Patient was agreeable to try that.  She informed that she did not find buspirone to be very helpful for anxiety and therefore is not really taking it.  She has been taking trazodone for sleep at bedtime which is very helpful.   Past Psychiatric History: Has been seen in [place] in the past, has had a few visits to the ED last year after the traumatic event for (command type auditory hallucinations) and suicidal thoughts  Previous Psychotropic Medications: Yes -Luvox, Mirtazepine, Buspar, Hydroxyzine, Zoloft (suspected allergy), Seroquel, Prazosin  Substance Abuse History in the last 12 months:  Yes.  Marijuana use 3 months ago.  Consequences of Substance Abuse: NA  Past Medical History:  Past Medical History:  Diagnosis Date  . BV (bacterial vaginosis)    No past surgical history on file.  Family Psychiatric History: Unknown diagnoses but reports dad is on medications for psychosis in the past and mother has paranoia and anxiety symptoms.  Family History: No family history on file.  Social History:   Social History  Socioeconomic History  . Marital status: Single    Spouse name: Not on file  . Number of children: Not on file  . Years of education: Not on file  . Highest education level: Not on file  Occupational History  . Not on file  Tobacco Use  . Smoking status: Current Some Day Smoker    Types: Cigarettes  . Smokeless tobacco: Never Used  Substance and Sexual Activity  . Alcohol use: Yes  . Drug use: Yes    Types: Marijuana  . Sexual activity: Not on file  Other Topics Concern  . Not on file  Social History  Narrative  . Not on file   Social Determinants of Health   Financial Resource Strain: Not on file  Food Insecurity: Not on file  Transportation Needs: Not on file  Physical Activity: Not on file  Stress: Not on file  Social Connections: Not on file    Additional Social History: Currently living with her parents, has double majors in criminal justice, aspires to be a Clinical research associate.  Was working in the past, has not held a job for about 2 years now.  Allergies:   Allergies  Allergen Reactions  . Hepatitis B Virus Vaccines Hives and Swelling    AT INJECTION SITE    Metabolic Disorder Labs: No results found for: HGBA1C, MPG No results found for: PROLACTIN No results found for: CHOL, TRIG, HDL, CHOLHDL, VLDL, LDLCALC Lab Results  Component Value Date   TSH 3.225 06/16/2019    Therapeutic Level Labs: No results found for: LITHIUM No results found for: CBMZ No results found for: VALPROATE  Current Medications: Current Outpatient Medications  Medication Sig Dispense Refill  . FLUoxetine (PROZAC) 40 MG capsule Take 1 capsule (40 mg total) by mouth daily. 30 capsule 1  . traZODone (DESYREL) 50 MG tablet Take 1 tablet (50 mg total) by mouth at bedtime as needed for sleep. 30 tablet 1   No current facility-administered medications for this visit.    Musculoskeletal: Strength & Muscle Tone: within normal limits Gait & Station: normal Patient leans: N/A  Psychiatric Specialty Exam: Review of Systems  There were no vitals taken for this visit.There is no height or weight on file to calculate BMI.  General Appearance: Fairly Groomed  Eye Contact:  Good  Speech:  Clear and Coherent and Normal Rate  Volume:  Normal  Mood:  Euthymic  Affect:  Congruent  Thought Process:  Goal Directed and Descriptions of Associations: Intact  Orientation:  Full (Time, Place, and Person)  Thought Content:  Logical  Suicidal Thoughts:  No  Homicidal Thoughts:  No  Memory:  Immediate;    Good Recent;   Good  Judgement:  Fair  Insight:  Fair  Psychomotor Activity:  Normal  Concentration:  Concentration: Good and Attention Span: Good  Recall:  Good  Fund of Knowledge:Good  Language: Good  Akathisia:  Negative  Handed:  Right  AIMS (if indicated):  Not done  Assets:  Communication Skills Desire for Improvement Financial Resources/Insurance Housing Social Support Transportation Vocational/Educational  ADL's:  Intact  Cognition: WNL  Sleep:  Good    Assessment and Plan: Pt is no longer taking fluoxetine due to severe headaches and was started on Prozac by her PCP about 2 weeks ago.  She has been on 20 mg dose for the past week.  She has been complaining of recurring intrusive thoughts and the writer recommends that she increase the dose of Prozac to 40 mg for  optimal control of the symptoms.  The plan is to see if her intrusive thoughts improve with higher dose of Prozac and if they do not improve then we will do a trial of clomipramine.  Patient was agreeable with this plan.  1. PTSD (post-traumatic stress disorder)  - traZODone (DESYREL) 50 MG tablet; Take 1 tablet (50 mg total) by mouth at bedtime as needed for sleep.  Dispense: 30 tablet; Refill: 1 - Increase FLUoxetine (PROZAC) 40 MG capsule; Take 1 capsule (40 mg total) by mouth daily.  Dispense: 30 capsule; Refill: 1   2. Recurrent major depressive disorder, in full remission (HCC) - Increase FLUoxetine (PROZAC) 40 MG capsule; Take 1 capsule (40 mg total) by mouth daily.  Dispense: 30 capsule; Refill: 1  - traZODone (DESYREL) 50 MG tablet; Take 1 tablet (50 mg total) by mouth at bedtime as needed for sleep.  Dispense: 30 tablet; Refill: 1  3. Mixed obsessional thoughts and acts  - Increase FLUoxetine (PROZAC) 40 MG capsule; Take 1 capsule (40 mg total) by mouth daily.  Dispense: 30 capsule; Refill: 1  F/up in 6 weeks.    Zena Amos, MD 2/28/20223:43 PM

## 2020-12-29 ENCOUNTER — Other Ambulatory Visit: Payer: Self-pay | Admitting: Nurse Practitioner

## 2020-12-29 DIAGNOSIS — N6310 Unspecified lump in the right breast, unspecified quadrant: Secondary | ICD-10-CM

## 2021-01-05 ENCOUNTER — Other Ambulatory Visit: Payer: Self-pay | Admitting: Family Medicine

## 2021-01-10 ENCOUNTER — Ambulatory Visit
Admission: RE | Admit: 2021-01-10 | Discharge: 2021-01-10 | Disposition: A | Discharge status: Home / Self Care | Payer: 59 | Source: Ambulatory Visit | Attending: Nurse Practitioner | Admitting: Nurse Practitioner

## 2021-01-10 ENCOUNTER — Ambulatory Visit
Admission: RE | Admit: 2021-01-10 | Discharge: 2021-01-10 | Disposition: A | Discharge status: Home / Self Care | Payer: Medicaid Other | Source: Ambulatory Visit | Attending: Nurse Practitioner | Admitting: Nurse Practitioner

## 2021-01-10 ENCOUNTER — Other Ambulatory Visit: Payer: Self-pay

## 2021-01-10 DIAGNOSIS — N6310 Unspecified lump in the right breast, unspecified quadrant: Secondary | ICD-10-CM

## 2021-01-11 ENCOUNTER — Ambulatory Visit (INDEPENDENT_AMBULATORY_CARE_PROVIDER_SITE_OTHER): Payer: 59 | Admitting: Psychiatry

## 2021-01-11 ENCOUNTER — Encounter (HOSPITAL_COMMUNITY): Payer: Self-pay | Admitting: Psychiatry

## 2021-01-11 VITALS — BP 119/77 | HR 100 | Temp 98.4°F | Ht 63.0 in | Wt 183.0 lb

## 2021-01-11 DIAGNOSIS — F3341 Major depressive disorder, recurrent, in partial remission: Secondary | ICD-10-CM

## 2021-01-11 DIAGNOSIS — F4310 Post-traumatic stress disorder, unspecified: Secondary | ICD-10-CM | POA: Diagnosis not present

## 2021-01-11 DIAGNOSIS — F3342 Major depressive disorder, recurrent, in full remission: Secondary | ICD-10-CM

## 2021-01-11 DIAGNOSIS — F422 Mixed obsessional thoughts and acts: Secondary | ICD-10-CM | POA: Diagnosis not present

## 2021-01-11 MED ORDER — FLUVOXAMINE MALEATE 50 MG PO TABS: 50.0000 mg | ORAL_TABLET | Freq: Every day | ORAL | 1 refills | Status: DC

## 2021-01-11 MED ORDER — ARIPIPRAZOLE 2 MG PO TABS: 2.0000 mg | ORAL_TABLET | Freq: Every day | ORAL | 1 refills | Status: DC

## 2021-01-11 MED ORDER — TRAZODONE HCL 50 MG PO TABS: 50.0000 mg | ORAL_TABLET | Freq: Every evening | ORAL | 1 refills | Status: DC | PRN

## 2021-01-11 NOTE — Progress Notes (Signed)
BH OP Progress Note   Patient Identification: Bonnie Turner MRN:  [id number] Date of Evaluation:  01/11/2021   Chief Complaint:  " I think things are getting worse."  Visit Diagnosis:    ICD-10-CM   1. Mixed obsessional thoughts and acts  F42.2   2. PTSD (post-traumatic stress disorder)  F43.10   3. MDD (major depressive disorder), recurrent, in partial remission (HCC)  F33.41     History of Present Illness: Patient reported that she believes things are getting worse for her.  She stated that her sleep is really off now.  She stated that she keeps having these pulsating sensations at the front of her forehead.  She also feels weird inside her head.  She stated that recently she is also started to have paranoid ideations of random things.  She stated that the other day when her mother asked her to communicate with her she suddenly had this paranoid ideation of her mother trying to poison her food. Similarly she had another incident when suddenly the people that were talking around her had a change in her voice and tone and that made the patient feel really strange. She stated that she is able usually able to reason with these kind of delusions but she is concerned about them reoccurring. She stated that she has been taking the Prozac in the morning and she does not really think it is helping much with her intrusive thoughts. She is also concerned about these paranoid ideations.  She also reported hearing some voices. Writer asked her if she would like to retry fluovoxamine since it was helping her with intrusive thoughts but it was discontinued by her PCP after patient complained of severe recurring headaches.  She was then switched to fluoxetine. Patient did report improvement in her headaches after fluvoxamine was discontinued. However her symptoms of anxiety, depression and OCD seem to be worse now. She is also complaining of worsening and frequent delusions and hallucinations.  Patient  stated that she was willing to retry flovoxamine since it did help her significantly.  She also asked for some medication to help her with the paranoid ideations and hallucinations.  She was offered a trial of Abilify.  She was explained that Abilify can augment the fluvoxamine and help with the delusions and hallucinations. Potential side effects of medication and risks vs benefits of treatment vs non-treatment were explained and discussed. All questions were answered.  Patient was agreeable to trying this combination along with continuing the trazodone for sleep.   Past Psychiatric History: Has been seen in [facility] in the past, has had a few visits to the ED last year after the traumatic event for (command type auditory hallucinations) and suicidal thoughts  Previous Psychotropic Medications: Yes -Luvox, Mirtazapine, Buspar, Hydroxyzine, Zoloft (suspected allergy), Seroquel, Prazosin, Prozac  Substance Abuse History in the last 12 months:  Yes.  Marijuana use 3 months ago.  Consequences of Substance Abuse: NA  Past Medical History:  Past Medical History:  Diagnosis Date  . BV (bacterial vaginosis)    No past surgical history on file.  Family Psychiatric History: Unknown diagnoses but reports dad is on medications for psychosis in the past and mother has paranoia and anxiety symptoms.  Family History: No family history on file.  Social History:   Social History   Socioeconomic History  . Marital status: Single    Spouse name: Not on file  . Number of children: Not on file  . Years of education: Not on  file  . Highest education level: Not on file  Occupational History  . Not on file  Tobacco Use  . Smoking status: Current Some Day Smoker    Types: Cigarettes  . Smokeless tobacco: Never Used  Substance and Sexual Activity  . Alcohol use: Yes  . Drug use: Yes    Types: Marijuana  . Sexual activity: Not on file  Other Topics Concern  . Not on file  Social History Narrative   . Not on file   Social Determinants of Health   Financial Resource Strain: Not on file  Food Insecurity: Not on file  Transportation Needs: Not on file  Physical Activity: Not on file  Stress: Not on file  Social Connections: Not on file    Additional Social History: Currently living with her parents, has double majors in criminal justice, aspires to be a Clinical research associate.  Was working in the past, has not held a job for about 2 years now.  Allergies:   Allergies  Allergen Reactions  . Hepatitis B Virus Vaccines Hives and Swelling    AT INJECTION SITE    Metabolic Disorder Labs: No results found for: HGBA1C, MPG No results found for: PROLACTIN No results found for: CHOL, TRIG, HDL, CHOLHDL, VLDL, LDLCALC Lab Results  Component Value Date   TSH 3.225 06/16/2019    Therapeutic Level Labs: No results found for: LITHIUM No results found for: CBMZ No results found for: VALPROATE  Current Medications: Current Outpatient Medications  Medication Sig Dispense Refill  . FLUoxetine (PROZAC) 40 MG capsule Take 1 capsule (40 mg total) by mouth daily. 30 capsule 1  . traZODone (DESYREL) 50 MG tablet Take 1 tablet (50 mg total) by mouth at bedtime as needed for sleep. 30 tablet 1   No current facility-administered medications for this visit.    Musculoskeletal: Strength & Muscle Tone: within normal limits Gait & Station: normal Patient leans: N/A  Psychiatric Specialty Exam: Review of Systems  Blood pressure 119/77, pulse 100, temperature 98.4 F (36.9 C), temperature source Oral, height 5\' 3"  (1.6 m), weight 183 lb (83 kg), last menstrual period 01/03/2021, SpO2 98 %.Body mass index is 32.42 kg/m.  General Appearance: Fairly Groomed  Eye Contact:  Good  Speech:  Clear and Coherent and Normal Rate  Volume:  Normal  Mood:  Euthymic  Affect:  Congruent  Thought Process:  Goal Directed and Descriptions of Associations: Intact  Orientation:  Full (Time, Place, and Person)  Thought  Content:  Logical  Suicidal Thoughts:  No  Homicidal Thoughts:  No  Memory:  Immediate;   Good Recent;   Good  Judgement:  Fair  Insight:  Fair  Psychomotor Activity:  Normal  Concentration:  Concentration: Good and Attention Span: Good  Recall:  Good  Fund of Knowledge:Good  Language: Good  Akathisia:  Negative  Handed:  Right  AIMS (if indicated):  Not done  Assets:  Communication Skills Desire for Improvement Financial Resources/Insurance Housing Social Support Transportation Vocational/Educational  ADL's:  Intact  Cognition: WNL  Sleep:  Good    Assessment and Plan: Patient reported that she does not think Prozac is helping her much and she also complained of new onset of paranoid ideations and hallucinations.  Based on everything writer recommended a retrial of fluvoxamine which was discontinued by her PCP because of complaints of headache.  Writer also recommended trial of Abilify to target her delusions and hallucinations. Potential side effects of medication and risks vs benefits of treatment vs non-treatment  were explained and discussed. All questions were answered.   1. PTSD (post-traumatic stress disorder)  - Continue traZODone (DESYREL) 50 MG tablet; Take 1 tablet (50 mg total) by mouth at bedtime as needed for sleep.  Dispense: 30 tablet; Refill: 1  2. MDD (major depressive disorder), recurrent, in partial remission (HCC)  - Restart fluvoxaMINE (LUVOX) 50 MG tablet; Take 1 tablet (50 mg total) by mouth at bedtime.  Dispense: 30 tablet; Refill: 1 - Start ARIPiprazole (ABILIFY) 2 MG tablet; Take 1 tablet (2 mg total) by mouth daily.  Dispense: 30 tablet; Refill: 1  3. Mixed obsessional thoughts and acts  -Restart fluvoxaMINE (LUVOX) 50 MG tablet; Take 1 tablet (50 mg total) by mouth at bedtime.  Dispense: 30 tablet; Refill: 1   F/up in 4-5 weeks for close monitoring.    Zena Amos, MD 4/5/20222:00 PM

## 2021-02-14 ENCOUNTER — Ambulatory Visit (HOSPITAL_COMMUNITY): Payer: 59 | Admitting: Psychiatry

## 2021-02-14 ENCOUNTER — Other Ambulatory Visit: Payer: Medicaid Other

## 2021-02-14 ENCOUNTER — Encounter (HOSPITAL_COMMUNITY): Payer: Self-pay

## 2021-07-29 ENCOUNTER — Encounter (HOSPITAL_COMMUNITY): Payer: Self-pay | Admitting: Emergency Medicine

## 2021-07-29 ENCOUNTER — Ambulatory Visit (HOSPITAL_COMMUNITY)
Admission: EM | Admit: 2021-07-29 | Discharge: 2021-07-29 | Disposition: A | Discharge status: Home / Self Care | Payer: 59 | Attending: Student | Admitting: Student

## 2021-07-29 ENCOUNTER — Other Ambulatory Visit: Payer: Self-pay

## 2021-07-29 DIAGNOSIS — A09 Infectious gastroenteritis and colitis, unspecified: Secondary | ICD-10-CM | POA: Diagnosis present

## 2021-07-29 DIAGNOSIS — Z8639 Personal history of other endocrine, nutritional and metabolic disease: Secondary | ICD-10-CM

## 2021-07-29 LAB — BASIC METABOLIC PANEL
Anion gap: 7 (ref 5–15)
BUN: 8 mg/dL (ref 6–20)
CO2: 26 mmol/L (ref 22–32)
Calcium: 9.1 mg/dL (ref 8.9–10.3)
Chloride: 105 mmol/L (ref 98–111)
Creatinine, Ser: 1.22 mg/dL — ABNORMAL HIGH (ref 0.44–1.00)
GFR, Estimated: >60 mL/min (ref >60)
Glucose, Bld: 76 mg/dL (ref 70–99)
Potassium: 3.4 mmol/L — ABNORMAL LOW (ref 3.5–5.1)
Sodium: 138 mmol/L (ref 135–145)

## 2021-07-29 MED ORDER — AZITHROMYCIN 500 MG PO TABS: 1000.0000 mg | ORAL_TABLET | Freq: Once | ORAL | 0 refills | Status: AC

## 2021-07-29 NOTE — Discharge Instructions (Addendum)
-  Azithromycin - two tablets taken at once -Drink plenty of fluids -We will call you if anything is positive with your lab work -Seek additional medical attention if symptoms getting worse instead of better, like abdominal pain, new nausea and vomiting.

## 2021-07-29 NOTE — ED Triage Notes (Signed)
Pt presents with c/o diarrhea and abdominal pain x 5 days. Pt states she has sharp stabbing pains associated with the diarrhea. Reports about 10-12 episodes a day. Pt also reports she was recently in [place] when the symptoms began and thinks her symptoms could be related to food poisoning or a stomach bug.

## 2021-07-29 NOTE — ED Provider Notes (Addendum)
[facility]    CSN: [id number] Arrival date & time: 07/29/21  1818      History   Chief Complaint Chief Complaint  Patient presents with   Abdominal Pain   Diarrhea    HPI Bonnie Turner is a 35 y.o. female presenting for abdominal pain and diarrhea for 5 days following traveling to [place] and [place].  Medical history noncontributory.  States she cannot be pregnant.  States that symptoms began while she was still overseas, she has been back for 2 days.  Copious watery diarrhea, 10-14 episodes daily.  Crampy generalized abdominal pain.  Denies blood or mucus in her stool.  Denies nausea or vomiting.  States that the friend she traveled with also got sick, but is feeling better already.  She does have a history of hypokalemia, so she has been eating a lot of bananas and potassium rich foods.  HPI  Past Medical History:  Diagnosis Date   BV (bacterial vaginosis)     Patient Active Problem List   Diagnosis Date Noted   Recurrent major depressive disorder, in full remission (HCC) 12/06/2020   Mixed obsessional thoughts and acts 12/06/2020   PTSD (post-traumatic stress disorder) 07/28/2020   MDD (major depressive disorder), recurrent episode, mild (HCC) 07/28/2020    History reviewed. No pertinent surgical history.  OB History   No obstetric history on file.      Home Medications    Prior to Admission medications   Medication Sig Start Date End Date Taking? Authorizing Provider  azithromycin (ZITHROMAX) 500 MG tablet Take 2 tablets (1,000 mg total) by mouth once for 1 dose. Take two tablets together 07/29/21 07/29/21 Yes Rhys Martini, PA-C  ARIPiprazole (ABILIFY) 2 MG tablet Take 1 tablet (2 mg total) by mouth daily. 01/11/21   Zena Amos, MD  fluvoxaMINE (LUVOX) 50 MG tablet Take 1 tablet (50 mg total) by mouth at bedtime. 01/11/21   Zena Amos, MD  traZODone (DESYREL) 50 MG tablet Take 1 tablet (50 mg total) by mouth at bedtime as needed for sleep.  01/11/21   Zena Amos, MD  ipratropium (ATROVENT) 0.06 % nasal spray Place 2 sprays into both nostrils 4 (four) times daily. Patient not taking: Reported on 04/27/2018 06/15/17 06/07/19  Lurline Idol    Family History History reviewed. No pertinent family history.  Social History Social History   Tobacco Use   Smoking status: Some Days    Types: Cigarettes   Smokeless tobacco: Never  Substance Use Topics   Alcohol use: Yes   Drug use: Yes    Types: Marijuana     Allergies   Hepatitis b virus vaccines   Review of Systems Review of Systems  Constitutional:  Negative for appetite change, chills, diaphoresis, fever and unexpected weight change.  HENT:  Negative for congestion, ear pain, sinus pressure, sinus pain, sneezing, sore throat and trouble swallowing.   Respiratory:  Negative for cough, chest tightness and shortness of breath.   Cardiovascular:  Negative for chest pain.  Gastrointestinal:  Positive for abdominal pain and diarrhea. Negative for abdominal distention, anal bleeding, blood in stool, constipation, nausea, rectal pain and vomiting.  Genitourinary:  Negative for dysuria, flank pain, frequency and urgency.  Musculoskeletal:  Negative for back pain and myalgias.  Neurological:  Negative for dizziness, light-headedness and headaches.  All other systems reviewed and are negative.   Physical Exam Triage Vital Signs ED Triage Vitals [07/29/21 1854]  Enc Vitals Group     BP  113/79     Pulse Rate 90     Resp 16     Temp 98.1 F (36.7 C)     Temp Source Oral     SpO2 98 %     Weight      Height      Head Circumference      Peak Flow      Pain Score 0     Pain Loc      Pain Edu?      Excl. in GC?    No data found.  Updated Vital Signs BP 113/79 (BP Location: Right Arm)   Pulse 90   Temp 98.1 F (36.7 C) (Oral)   Resp 16   SpO2 98%   Visual Acuity Right Eye Distance:   Left Eye Distance:   Bilateral Distance:    Right Eye Near:   Left Eye  Near:    Bilateral Near:     Physical Exam Vitals reviewed.  Constitutional:      General: She is not in acute distress.    Appearance: Normal appearance. She is not ill-appearing.  HENT:     Head: Normocephalic and atraumatic.     Mouth/Throat:     Mouth: Mucous membranes are moist.     Comments: Moist mucous membranes Eyes:     Extraocular Movements: Extraocular movements intact.     Pupils: Pupils are equal, round, and reactive to light.  Cardiovascular:     Rate and Rhythm: Normal rate and regular rhythm.     Heart sounds: Normal heart sounds.  Pulmonary:     Effort: Pulmonary effort is normal.     Breath sounds: Normal breath sounds. No wheezing, rhonchi or rales.  Abdominal:     General: Bowel sounds are increased. There is no distension.     Palpations: Abdomen is soft. There is no mass.     Tenderness: There is generalized abdominal tenderness. There is no right CVA tenderness, left CVA tenderness, guarding or rebound. Negative signs include Murphy's sign, Rovsing's sign and McBurney's sign.     Comments: BS increased throughout No guarding or rebound  Skin:    General: Skin is warm.     Capillary Refill: Capillary refill takes less than 2 seconds.     Comments: Good skin turgor  Neurological:     General: No focal deficit present.     Mental Status: She is alert and oriented to person, place, and time.  Psychiatric:        Mood and Affect: Mood normal.        Behavior: Behavior normal.     UC Treatments / Results  Labs (all labs ordered are listed, but only abnormal results are displayed) Labs Reviewed  GASTROINTESTINAL PANEL BY PCR, STOOL (REPLACES STOOL CULTURE)  BASIC METABOLIC PANEL    EKG   Radiology No results found.  Procedures Procedures (including critical care time)  Medications Ordered in UC Medications - No data to display  Initial Impression / Assessment and Plan / UC Course  I have reviewed the triage vital signs and the nursing  notes.  Pertinent labs & imaging results that were available during my care of the patient were reviewed by me and considered in my medical decision making (see chart for details).     This patient is a very pleasant 35 y.o. year old female presenting with traveller's diarrhea. Afebrile, nontachycardic, appears well hydrated.  Recent travel to [place] and [place].  States she is not  pregnant or breastfeeding. Stool sample sent.  History hypokalemia- will check a BMP. Will manage with azithromycin, Brat diet, good hydration  *GI panel positive for E coli species; treated appropriately with azithromycin. Potassium level 3.4, continue high-potassium diet.  ED return precautions discussed. Patient verbalizes understanding and agreement.   Final Clinical Impressions(s) / UC Diagnoses   Final diagnoses:  Traveler's diarrhea     Discharge Instructions      -Azithromycin - two tablets taken at once -Drink plenty of fluids -We will call you if anything is positive with your lab work -Seek additional medical attention if symptoms getting worse instead of better, like abdominal pain, new nausea and vomiting.   ED Prescriptions     Medication Sig Dispense Auth. Provider   azithromycin (ZITHROMAX) 500 MG tablet Take 2 tablets (1,000 mg total) by mouth once for 1 dose. Take two tablets together 2 tablet Rhys Martini, PA-C      PDMP not reviewed this encounter.   Rhys Martini, PA-C 07/29/21 1939    Rhys Martini, PA-C 07/30/21 [phone]

## 2021-07-30 LAB — GASTROINTESTINAL PANEL BY PCR, STOOL (REPLACES STOOL CULTURE)
Adenovirus F40/41: NOT DETECTED
Astrovirus: NOT DETECTED
Campylobacter species: NOT DETECTED
Cryptosporidium: NOT DETECTED
Cyclospora cayetanensis: NOT DETECTED
Entamoeba histolytica: NOT DETECTED
Enteroaggregative E coli (EAEC): DETECTED — AB
Enteropathogenic E coli (EPEC): DETECTED — AB
Enterotoxigenic E coli (ETEC): DETECTED — AB
Giardia lamblia: NOT DETECTED
Norovirus GI/GII: NOT DETECTED
Plesimonas shigelloides: NOT DETECTED
Rotavirus A: NOT DETECTED
Salmonella species: NOT DETECTED
Sapovirus (I, II, IV, and V): NOT DETECTED
Shiga like toxin producing E coli (STEC): NOT DETECTED
Shigella/Enteroinvasive E coli (EIEC): NOT DETECTED
Vibrio cholerae: NOT DETECTED
Vibrio species: NOT DETECTED
Yersinia enterocolitica: NOT DETECTED

## 2021-12-21 IMAGING — MG DIGITAL DIAGNOSTIC BILAT W/ TOMO W/ CAD
8 series · 8 of 24 positions shown · non-contrast
Comparison: None.

CLINICAL DATA: Mass and focal tenderness felt on recent physical
examination in the 6 o'clock position of the right breast. The
patient cannot feel a mass or pinpoint the area today. Family
history of breast cancer a great grandmother and great aunts.

EXAM:
DIGITAL DIAGNOSTIC BILATERAL MAMMOGRAM WITH TOMOSYNTHESIS AND CAD;
ULTRASOUND RIGHT BREAST LIMITED
TECHNIQUE: Bilateral digital diagnostic mammography and breast tomosynthesis
was performed. The images were evaluated with computer-aided
detection.; Targeted ultrasound examination of the right breast was
performed

[L CC synth-2D]
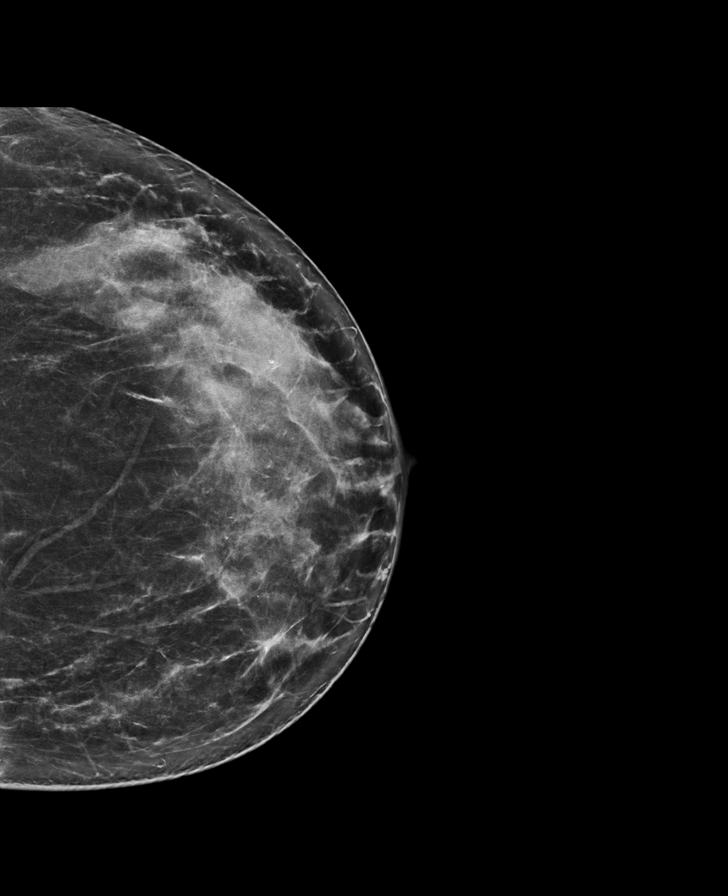

[L MLO synth-2D]
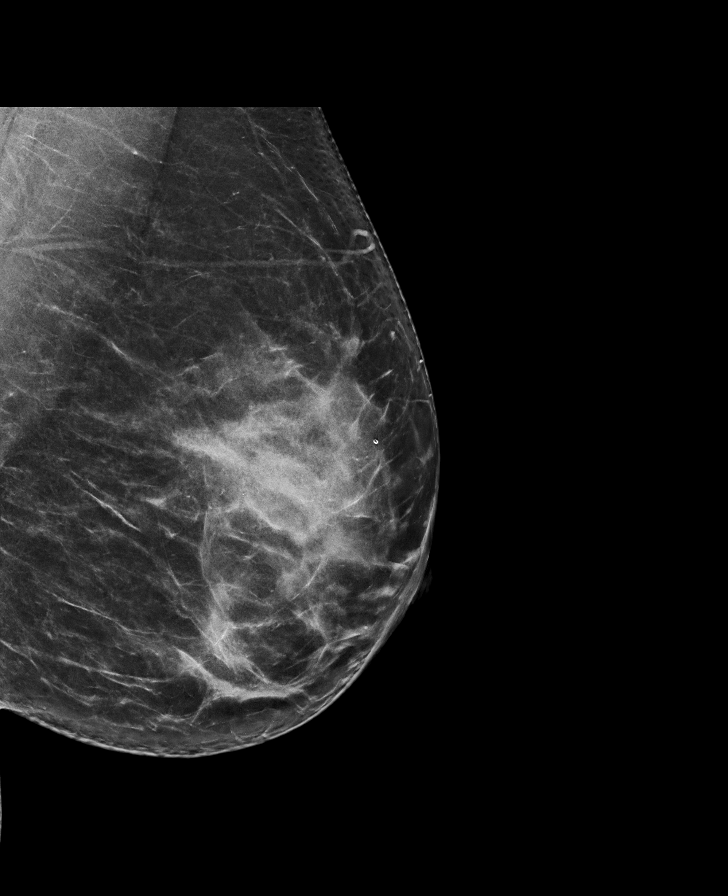

[R MLO synth-2D]
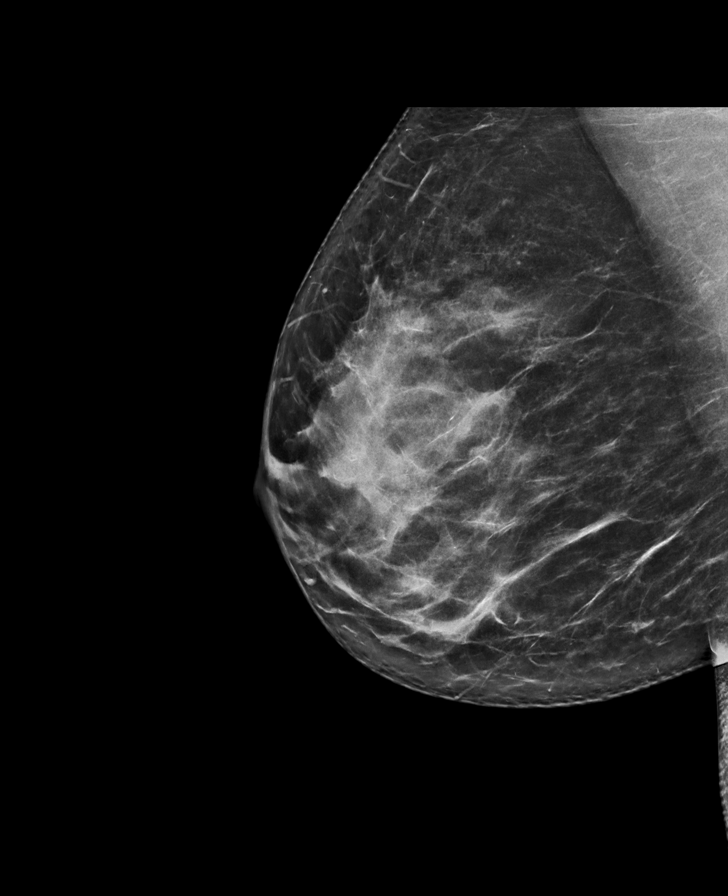

[R CC synth-2D]
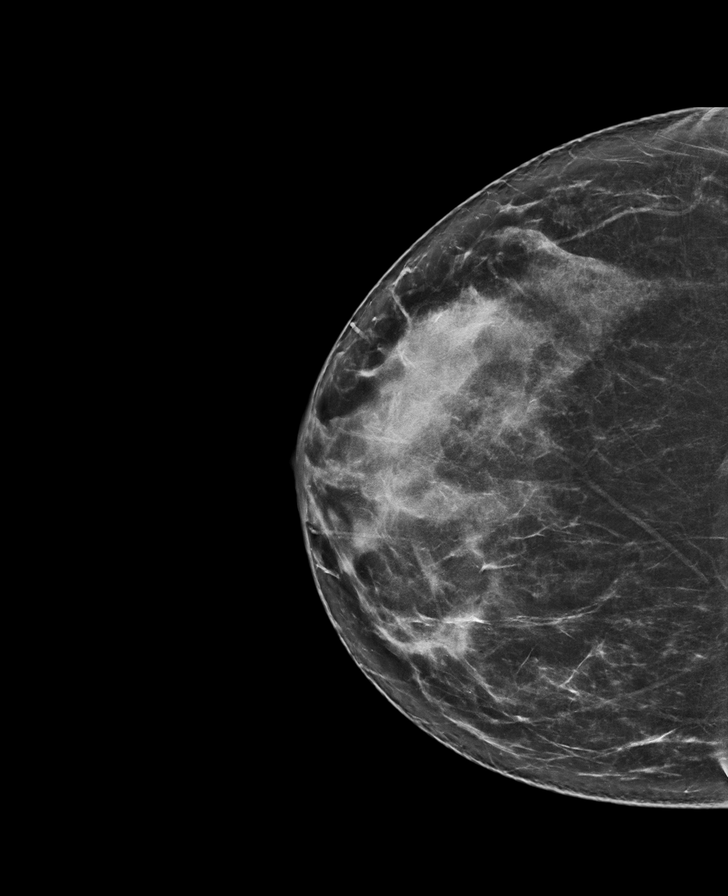

[R CC tomo · tomo slice 43/84.0]
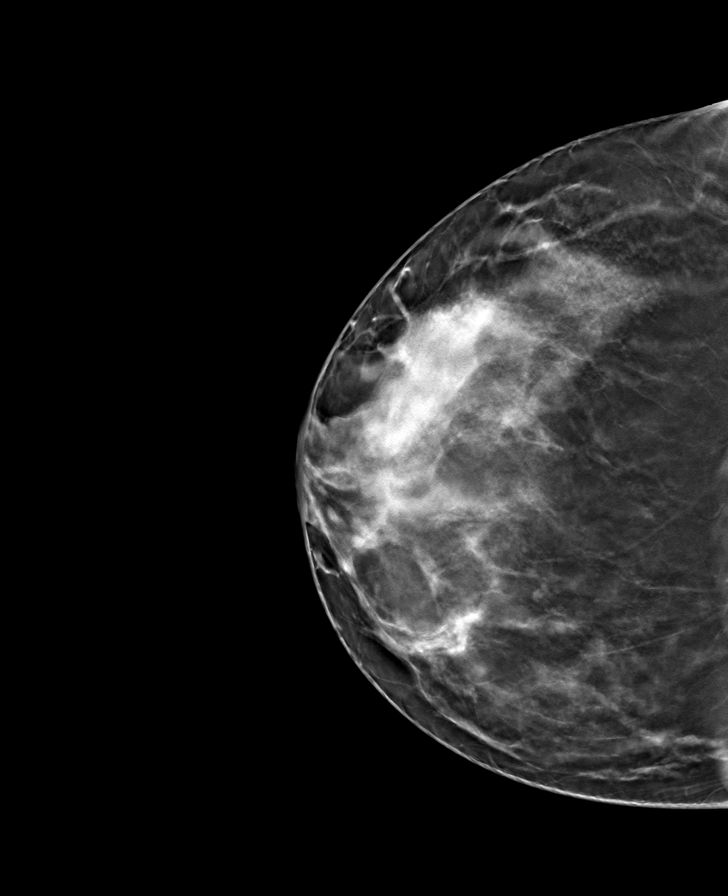

[L CC tomo · tomo slice 42/83.0]
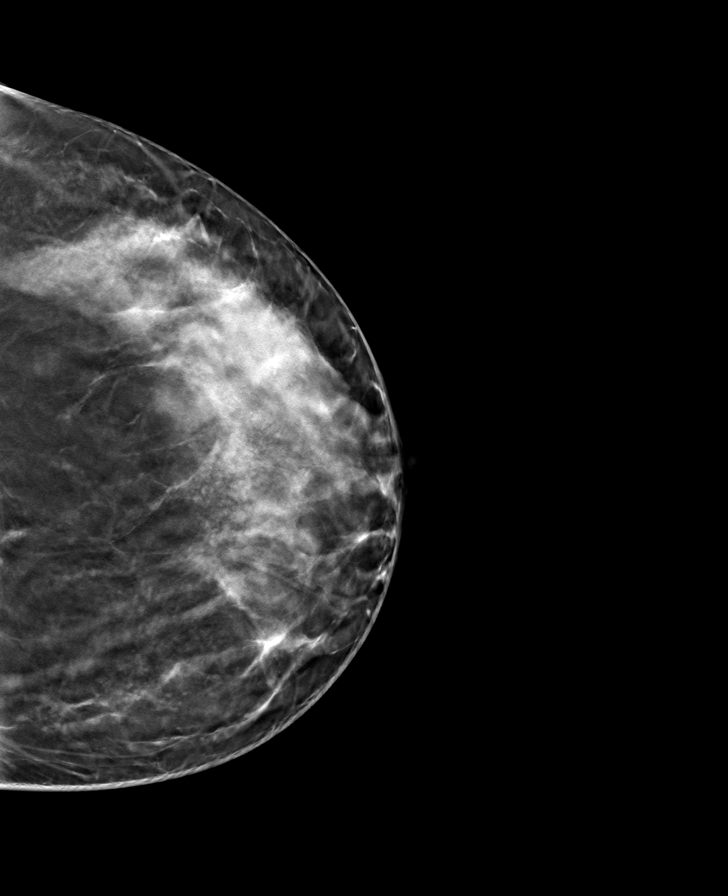

[R MLO tomo · tomo slice 45/89.0]
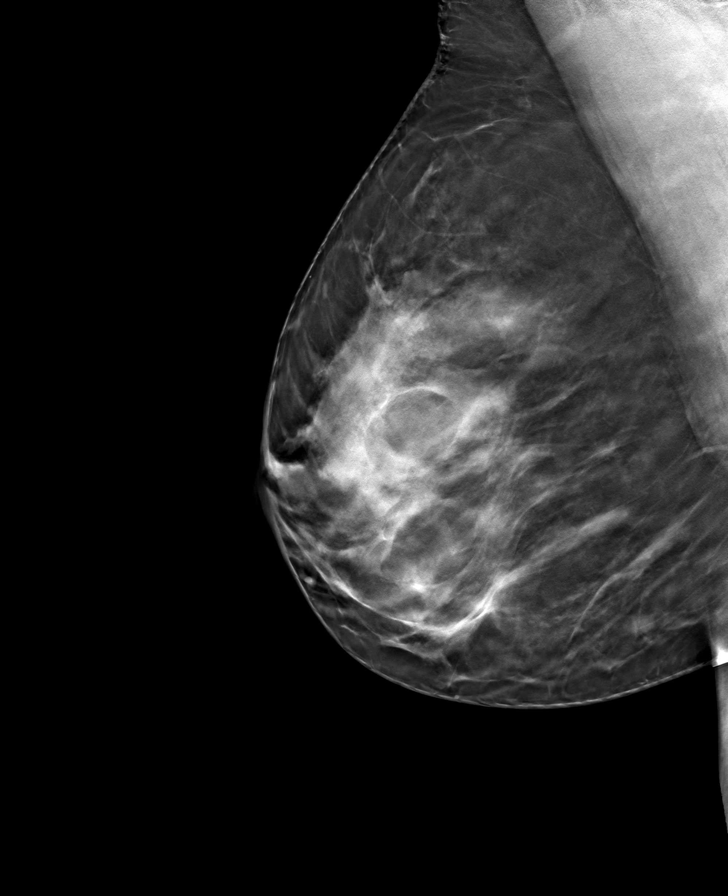

[L MLO tomo · tomo slice 44/87.0]
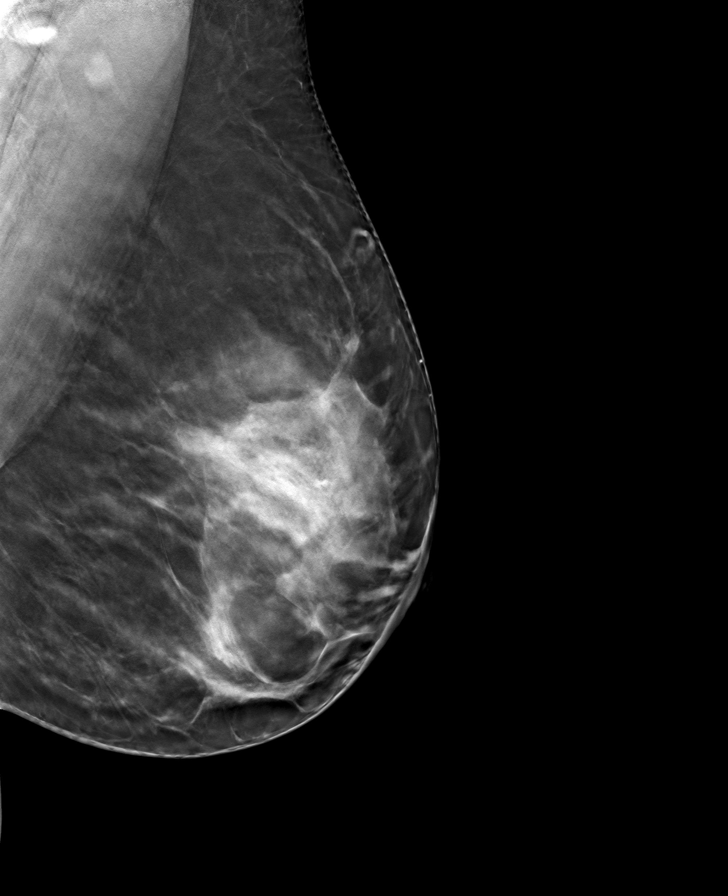

[8 of 24 positions shown; findings below may reference images not displayed]

Baseline.

ACR Breast Density Category c: The breast tissue is heterogeneously
dense, which may obscure small masses.
FINDINGS: Mammographically normal appearing breasts with mass or other
findings suspicious for malignancy seen.

On physical exam, no mass is palpable in the 6 o'clock position of
the right breast today. The patient is diffusely tender throughout
that portion of the breast.

Targeted ultrasound is performed, showing normal appearing breast
tissue throughout the inferior right breast in the 6 o'clock region.
IMPRESSION: No evidence of malignancy.

RECOMMENDATION:
Annual screening mammography beginning at age 40.

I have discussed the findings and recommendations with the patient.
If applicable, a reminder letter will be sent to the patient
regarding the next appointment.

BI-RADS CATEGORY  1: Negative.

## 2022-04-03 ENCOUNTER — Ambulatory Visit
Admission: RE | Admit: 2022-04-03 | Discharge: 2022-04-03 | Disposition: A | Discharge status: Home / Self Care | Payer: Self-pay | Source: Ambulatory Visit | Attending: Family Medicine | Admitting: Family Medicine

## 2022-04-03 ENCOUNTER — Other Ambulatory Visit: Payer: Self-pay | Admitting: Family Medicine

## 2022-04-03 DIAGNOSIS — Z01818 Encounter for other preprocedural examination: Secondary | ICD-10-CM

## 2022-04-10 ENCOUNTER — Ambulatory Visit (HOSPITAL_COMMUNITY)
Admission: EM | Admit: 2022-04-10 | Discharge: 2022-04-10 | Disposition: A | Discharge status: Home / Self Care | Payer: No Payment, Other | Attending: Psychiatry | Admitting: Psychiatry

## 2022-04-10 DIAGNOSIS — Z7689 Persons encountering health services in other specified circumstances: Secondary | ICD-10-CM

## 2022-04-10 DIAGNOSIS — Z008 Encounter for other general examination: Secondary | ICD-10-CM | POA: Insufficient documentation

## 2022-04-10 NOTE — ED Provider Notes (Signed)
[facility] Medical Screening Exam  Patient Name: Bonnie Turner MRN: [id number] Date of Evaluation: 04/10/22 Chief Complaint:   Diagnosis:  Final diagnoses:  Encounter for psychiatric assessment    History of Present illness: Bonnie Turner is a 36 y.o. female.  Presents significantly urgent care requesting a medical clearance assessment.  She reports she was followed by psychiatry and therapy services a year and 2 months ago.  States she was diagnosed with PTSD anxiety and panic disorder.  States she had taken her self off of her medications.  States she was prescribed Abilify, trazodone and Luvox.  Reports she discontinued her medications when she started feeling better.  She does report " psychotic episode."   Currently she is denying suicidal or homicidal ideations.  Denies auditory or visual hallucinations.  Denied that she is being followed by therapy and/or psychiatry currently.  Denied any psychotropic medications.  Chart reviewed no documented history with psychiatry follow-up.  Education was provided with following up with psychiatry for medical clearance exam.  Patient was provided with additional outpatient resources.  Support, encouragement and reassurance was provided.  During evaluation Bonnie Turner is sitting in no acute distress. She is alert/oriented x 4; calm/cooperative; and mood congruent with affect.  She is speaking in a clear tone at moderate volume, and normal pace; with good eye contact.  Her thought process is coherent and relevant; There is no indication that she is currently responding to internal/external stimuli or experiencing delusional thought content; and she has denied suicidal/self-harm/homicidal ideation, psychosis, and paranoia.   Patient has remained calm throughout assessment and has answered questions appropriately.    At this time Bonnie Turner is educated and verbalizes understanding of mental health resources and other  crisis services in the community. She is instructed to call 911 and present to the nearest emergency room should she experience any suicidal/homicidal ideation, auditory/visual/hallucinations, or detrimental worsening of her mental health condition.  She was a also advised by Clinical research associate that she could call the toll-free phone on insurance card to assist with identifying in network counselors and agencies or number on back of Medicaid card to speak with care coordinator    Psychiatric Specialty Exam  Presentation  General Appearance:Appropriate for Environment  Eye Contact:Good  Speech:Clear and Coherent  Speech Volume:Normal  Handedness:Right   Mood and Affect  Mood:Anxious Affect:Congruent  Thought Process  Thought Processes:Coherent Descriptions of Associations:Intact  Orientation:Full (Time, Place and Person)  Thought Content:Logical    Hallucinations:None  Ideas of Reference:None  Suicidal Thoughts:No  Homicidal Thoughts:No   Sensorium  Memory:Immediate Good; Recent Good; Remote Good Judgment:Fair Insight:Fair  Executive Functions  Concentration:Good Attention Span:Good Recall:Good Fund of Knowledge:Good Language:Good  Psychomotor Activity  Psychomotor Activity:Normal  Assets  Assets:Social Support; Manufacturing systems engineer  Sleep  Sleep:Good Number of hours: No data recorded  Nutritional Assessment (For [facility] and [facility] admissions only) Has the patient had a weight loss or gain of 10 pounds or more in the last 3 months?: No Has the patient had a decrease in food intake/or appetite?: No Does the patient have dental problems?: No Does the patient have eating habits or behaviors that may be indicators of an eating disorder including binging or inducing vomiting?: No Has the patient recently lost weight without trying?: 0 Has the patient been eating poorly because of a decreased appetite?: 0 Malnutrition Screening Tool Score: 0    Physical Exam: Physical  Exam Nursing note reviewed.  Cardiovascular:     Rate and  Rhythm: Normal rate and regular rhythm.  Neurological:     Mental Status: She is alert.  Psychiatric:        Attention and Perception: Attention and perception normal.        Mood and Affect: Mood normal.        Speech: Speech normal.        Behavior: Behavior normal.        Thought Content: Thought content normal.        Cognition and Memory: Cognition and memory normal.        Judgment: Judgment normal.    Review of Systems  Eyes: Negative.   Cardiovascular: Negative.   Psychiatric/Behavioral:  Negative for depression and suicidal ideas. The patient is not nervous/anxious.   All other systems reviewed and are negative.  Blood pressure 105/67, pulse 87, temperature 98.3 F (36.8 C), temperature source Oral, resp. rate 18, last menstrual period 03/17/2022, SpO2 98 %. There is no height or weight on file to calculate BMI.  Musculoskeletal: Strength & Muscle Tone: within normal limits Gait & Station: normal Patient leans: N/A   [facility] MSE Discharge Disposition for Follow up and Recommendations: Based on my evaluation the patient does not appear to have an emergency medical condition and can be discharged with resources and follow up care in outpatient services for Medical Clearance    Oneta Rack, NP 04/10/2022, 9:58 AM

## 2022-04-10 NOTE — Discharge Instructions (Addendum)
Take all medications as prescribed. Keep all follow-up appointments as scheduled.  Do not consume alcohol or use illegal drugs while on prescription medications. Report any adverse effects from your medications to your primary care provider promptly.  In the event of recurrent symptoms or worsening symptoms, call 911, a crisis hotline, or go to the nearest emergency department for evaluation.   

## 2022-04-10 NOTE — ED Triage Notes (Signed)
Pt presents to [facility] voluntarily seeking a psych evaluation for her upcoming plastic surgery. Pt states it is required by her surgeon prior to receiving the surgery due to her hx of PTSD. Pt denies SI/HI and AVH.

## 2022-04-27 ENCOUNTER — Telehealth (HOSPITAL_COMMUNITY): Payer: Self-pay | Admitting: General Practice

## 2022-04-27 NOTE — BH Assessment (Signed)
Care Management - [facility] Follow Up Discharges  ° °Writer attempted to make contact with patient today and was unsuccessful.  Writer left a HIPPA compliant voice message.  ° °Per chart review, patient was provided with outpatient resources. ° °

## 2022-06-22 ENCOUNTER — Emergency Department (HOSPITAL_COMMUNITY)
Admission: EM | Admit: 2022-06-22 | Discharge: 2022-06-22 | Disposition: A | Discharge status: Home / Self Care | Payer: Medicaid Other | Attending: Emergency Medicine | Admitting: Emergency Medicine

## 2022-06-22 ENCOUNTER — Other Ambulatory Visit: Payer: Self-pay

## 2022-06-22 DIAGNOSIS — R1084 Generalized abdominal pain: Secondary | ICD-10-CM

## 2022-06-22 DIAGNOSIS — G8918 Other acute postprocedural pain: Secondary | ICD-10-CM | POA: Insufficient documentation

## 2022-06-22 LAB — CBC WITH DIFFERENTIAL/PLATELET
Abs Immature Granulocytes: 0.01 10*3/uL (ref 0.00–0.07)
Basophils Absolute: 0 10*3/uL (ref 0.0–0.1)
Basophils Relative: 1 %
Eosinophils Absolute: 0.1 10*3/uL (ref 0.0–0.5)
Eosinophils Relative: 2 %
HCT: 42.7 % (ref 36.0–46.0)
Hemoglobin: 14.3 g/dL (ref 12.0–15.0)
Immature Granulocytes: 0 %
Lymphocytes Relative: 31 %
Lymphs Abs: 2.5 10*3/uL (ref 0.7–4.0)
MCH: 32.4 pg (ref 26.0–34.0)
MCHC: 33.5 g/dL (ref 30.0–36.0)
MCV: 96.8 fL (ref 80.0–100.0)
Monocytes Absolute: 0.4 10*3/uL (ref 0.1–1.0)
Monocytes Relative: 5 %
Neutro Abs: 5.1 10*3/uL (ref 1.7–7.7)
Neutrophils Relative %: 61 %
Platelets: 344 10*3/uL (ref 150–400)
RBC: 4.41 MIL/uL (ref 3.87–5.11)
RDW: 12.2 % (ref 11.5–15.5)
WBC: 8.3 10*3/uL (ref 4.0–10.5)
nRBC: 0 % (ref 0.0–0.2)

## 2022-06-22 LAB — LACTIC ACID, PLASMA: Lactic Acid, Venous: 0.9 mmol/L (ref 0.5–1.9)

## 2022-06-22 LAB — COMPREHENSIVE METABOLIC PANEL
ALT: 39 U/L (ref 0–44)
AST: 32 U/L (ref 15–41)
Albumin: 4.2 g/dL (ref 3.5–5.0)
Alkaline Phosphatase: 50 U/L (ref 38–126)
Anion gap: 6 (ref 5–15)
BUN: 13 mg/dL (ref 6–20)
CO2: 26 mmol/L (ref 22–32)
Calcium: 9 mg/dL (ref 8.9–10.3)
Chloride: 105 mmol/L (ref 98–111)
Creatinine, Ser: 1.07 mg/dL — ABNORMAL HIGH (ref 0.44–1.00)
GFR, Estimated: >60 mL/min (ref >60)
Glucose, Bld: 76 mg/dL (ref 70–99)
Potassium: 3.7 mmol/L (ref 3.5–5.1)
Sodium: 137 mmol/L (ref 135–145)
Total Bilirubin: 0.3 mg/dL (ref 0.3–1.2)
Total Protein: 7.7 g/dL (ref 6.5–8.1)

## 2022-06-22 LAB — I-STAT BETA HCG BLOOD, ED (MC, WL, AP ONLY): I-stat hCG, quantitative: <5 m[IU]/mL (ref <5)

## 2022-06-22 NOTE — ED Triage Notes (Signed)
Pt presents approximately 8 weeks post brazilian butt lift, 360 lipo suction, and abdominal etching surgery.  Pt has had 2 days of hardening of her abdomen with redness and heat.  Is concerned for cellulitis.

## 2022-06-22 NOTE — ED Notes (Signed)
ED provider at bedside.

## 2022-06-22 NOTE — ED Provider Notes (Signed)
[facility]-EMERGENCY DEPT Provider Note   CSN: [id number] Arrival date & time: 06/22/22  0043     History  Chief Complaint  Patient presents with   Post-op Problem    Bonnie Turner is a 36 y.o. female.  The history is provided by the patient.  She has history of depression, posttraumatic stress disorder and comes in because of concern for possible cellulitis.  2 months ago, she had cosmetic surgery of liposuction, butt lift, and was doing well until about 2 days ago when she noted it felt like the skin of her abdomen and lower back was on fire and was uncomfortable to have anything touch it, even clothing.  She states that the skin feels hard.  She denies actual fever or chills or sweats.  She denies nausea or vomiting.  She talked with a friend to his nurse who is concerned that she may be developing cellulitis.   Home Medications Prior to Admission medications   Medication Sig Start Date End Date Taking? Authorizing Provider  ARIPiprazole (ABILIFY) 2 MG tablet Take 1 tablet (2 mg total) by mouth daily. 01/11/21   Zena Amos, MD  fluvoxaMINE (LUVOX) 50 MG tablet Take 1 tablet (50 mg total) by mouth at bedtime. 01/11/21   Zena Amos, MD  traZODone (DESYREL) 50 MG tablet Take 1 tablet (50 mg total) by mouth at bedtime as needed for sleep. 01/11/21   Zena Amos, MD  ipratropium (ATROVENT) 0.06 % nasal spray Place 2 sprays into both nostrils 4 (four) times daily. Patient not taking: Reported on 04/27/2018 06/15/17 06/07/19  Belinda Fisher, PA-C      Allergies    Hepatitis b virus vaccines    Review of Systems   Review of Systems  All other systems reviewed and are negative.   Physical Exam Updated Vital Signs BP 107/71 (BP Location: Left Arm)   Pulse 88   Temp 98.3 F (36.8 C) (Oral)   Resp 18   Ht 5\' 4"  (1.626 m)   Wt 81.6 kg   SpO2 100%   BMI 30.90 kg/m  Physical Exam Vitals and nursing note reviewed.   36 year old female, resting comfortably and  in no acute distress. Vital signs are normal. Oxygen saturation is 100%, which is normal. Head is normocephalic and atraumatic. PERRLA, EOMI. Oropharynx is clear. Neck is nontender and supple without adenopathy or JVD. Back is nontender and there is no CVA tenderness. Lungs are clear without rales, wheezes, or rhonchi. Chest is nontender. Heart has regular rate and rhythm without murmur. Abdomen is soft, flat, nontender. Extremities have no cyanosis or edema, full range of motion is present. Skin is warm and dry without rash. Neurologic: Mental status is normal, cranial nerves are intact, moves all extremities equally.  ED Results / Procedures / Treatments   Labs (all labs ordered are listed, but only abnormal results are displayed) Labs Reviewed  COMPREHENSIVE METABOLIC PANEL - Abnormal; Notable for the following components:      Result Value   Creatinine, Ser 1.07 (*)    All other components within normal limits  LACTIC ACID, PLASMA  CBC WITH DIFFERENTIAL/PLATELET  LACTIC ACID, PLASMA  I-STAT BETA HCG BLOOD, ED ([facility], [facility], [facility] ONLY)   Procedures Procedures    Medications Ordered in ED Medications - No data to display  ED Course/ Medical Decision Making/ A&P  Medical Decision Making Amount and/or Complexity of Data Reviewed Labs: ordered.   Complaints of burning of the skin and hardness of the skin with no abnormal findings on exam.  She has several sites of trocar insertion which are healing appropriately.  No evidence of cellulitis or dermatitis.  I reviewed her laboratory tests and my interpretation is normal CBC, normal lactic acid level, normal metabolic panel with exception of borderline elevated creatinine which is not felt to be clinically significant.  Patient is reassured that there is no evidence of infection on her exam or on her laboratory testing.  I recommended she use over-the-counter NSAIDs and acetaminophen as needed for pain, return if  she develops any new or concerning symptoms.  Final Clinical Impression(s) / ED Diagnoses Final diagnoses:  Generalized abdominal pain    Rx / DC Orders ED Discharge Orders     None         Dione Booze, MD 06/22/22 [phone]

## 2022-06-22 NOTE — Discharge Instructions (Signed)
Your evaluation here did not show any evidence of infection or any other serious problems.  You may take ibuprofen or naproxen as needed for pain.  If you need additional pain relief, you may add acetaminophen.  You may also try applying ice to the areas that are painful.  Return if you have any new or concerning symptoms.

## 2023-05-09 ENCOUNTER — Other Ambulatory Visit: Payer: Self-pay

## 2023-05-09 ENCOUNTER — Encounter (HOSPITAL_COMMUNITY): Payer: Self-pay

## 2023-05-09 ENCOUNTER — Emergency Department (HOSPITAL_COMMUNITY)
Admission: EM | Admit: 2023-05-09 | Discharge: 2023-05-09 | Disposition: A | Discharge status: Home / Self Care | Payer: Commercial Managed Care - HMO | Attending: Emergency Medicine | Admitting: Emergency Medicine

## 2023-05-09 DIAGNOSIS — R42 Dizziness and giddiness: Secondary | ICD-10-CM | POA: Insufficient documentation

## 2023-05-09 DIAGNOSIS — R55 Syncope and collapse: Secondary | ICD-10-CM | POA: Diagnosis not present

## 2023-05-09 DIAGNOSIS — R7989 Other specified abnormal findings of blood chemistry: Secondary | ICD-10-CM

## 2023-05-09 LAB — URINALYSIS, ROUTINE W REFLEX MICROSCOPIC
Bilirubin Urine: NEGATIVE
Glucose, UA: NEGATIVE mg/dL
Ketones, ur: NEGATIVE mg/dL
Leukocytes,Ua: NEGATIVE
Nitrite: NEGATIVE
Protein, ur: NEGATIVE mg/dL
Specific Gravity, Urine: 1.021 (ref 1.005–1.030)
pH: 5 (ref 5.0–8.0)

## 2023-05-09 LAB — CBC
HCT: 40.9 % (ref 36.0–46.0)
Hemoglobin: 13.9 g/dL (ref 12.0–15.0)
MCH: 31.8 pg (ref 26.0–34.0)
MCHC: 34 g/dL (ref 30.0–36.0)
MCV: 93.6 fL (ref 80.0–100.0)
Platelets: 473 10*3/uL — ABNORMAL HIGH (ref 150–400)
RBC: 4.37 MIL/uL (ref 3.87–5.11)
RDW: 11.7 % (ref 11.5–15.5)
WBC: 9.9 10*3/uL (ref 4.0–10.5)
nRBC: 0 % (ref 0.0–0.2)

## 2023-05-09 LAB — BASIC METABOLIC PANEL
Anion gap: 8 (ref 5–15)
BUN: 11 mg/dL (ref 6–20)
CO2: 25 mmol/L (ref 22–32)
Calcium: 9 mg/dL (ref 8.9–10.3)
Chloride: 102 mmol/L (ref 98–111)
Creatinine, Ser: 0.89 mg/dL (ref 0.44–1.00)
GFR, Estimated: >60 mL/min (ref >60)
Glucose, Bld: 94 mg/dL (ref 70–99)
Potassium: 3.3 mmol/L — ABNORMAL LOW (ref 3.5–5.1)
Sodium: 135 mmol/L (ref 135–145)

## 2023-05-09 LAB — TSH: TSH: 6.12 u[IU]/mL — ABNORMAL HIGH (ref 0.350–4.500)

## 2023-05-09 LAB — PREGNANCY, URINE: Preg Test, Ur: NEGATIVE

## 2023-05-09 MED ORDER — LACTATED RINGERS IV BOLUS
1000.0000 mL | Freq: Once | INTRAVENOUS | Status: AC
  Administered 2023-05-09: 1000 mL via INTRAVENOUS

## 2023-05-09 NOTE — ED Triage Notes (Signed)
Arrives [facility] from the parking lot. Pt was driving home from work when she had sudden episodes of dizziness and sweating.   Says she has had low potassium before with similar presentation.   Ambulatory in triage.

## 2023-05-09 NOTE — ED Provider Notes (Signed)
[place] EMERGENCY DEPARTMENT AT [facility] Provider Note   CSN: [id number] Arrival date & time: 05/09/23  0310     History  Chief Complaint  Patient presents with   Dizziness    Bonnie Turner is a 37 y.o. female.  Patient is a 37 year old female with prior history of PTSD, panic disorder and syncope who presents today because of an episode she had while driving last night.  She reports she was in the car when she for about 2 minutes had a sensation that she was going to pass out.  She said her head felt like it was spinning and she was a bit disoriented.  She cannot remember if maybe like her heart was palpitating.  She denied any nausea, vomiting, visual changes, inability to walk or speak.  She reports now she is felt fine and she is felt fine while waiting in the waiting room for the last 6 hours.  She has no chest pain, shortness of breath, no leg swelling.  She eats and drinks regularly no history of eating disorders.  The only medication she is on is birth control which she has been on for quite a while.  The history is provided by the patient.  Dizziness      Home Medications Prior to Admission medications   Medication Sig Start Date End Date Taking? Authorizing Provider  ARIPiprazole (ABILIFY) 2 MG tablet Take 1 tablet (2 mg total) by mouth daily. 01/11/21   Zena Amos, MD  fluvoxaMINE (LUVOX) 50 MG tablet Take 1 tablet (50 mg total) by mouth at bedtime. 01/11/21   Zena Amos, MD  traZODone (DESYREL) 50 MG tablet Take 1 tablet (50 mg total) by mouth at bedtime as needed for sleep. 01/11/21   Zena Amos, MD  ipratropium (ATROVENT) 0.06 % nasal spray Place 2 sprays into both nostrils 4 (four) times daily. Patient not taking: Reported on 04/27/2018 06/15/17 06/07/19  Belinda Fisher, PA-C      Allergies    Hepatitis b virus vaccines    Review of Systems   Review of Systems  Neurological:  Positive for dizziness.    Physical Exam Updated Vital Signs BP  93/63   Pulse 72   Temp 97.6 F (36.4 C) (Oral)   Resp 12   Ht 5\' 4"  (1.626 m)   Wt 81.6 kg   SpO2 100%   BMI 30.90 kg/m  Physical Exam Vitals and nursing note reviewed.  Constitutional:      General: She is not in acute distress.    Appearance: She is well-developed.  HENT:     Head: Normocephalic and atraumatic.     Right Ear: Tympanic membrane normal.     Left Ear: Tympanic membrane normal.  Eyes:     Pupils: Pupils are equal, round, and reactive to light.  Cardiovascular:     Rate and Rhythm: Normal rate and regular rhythm.     Heart sounds: Normal heart sounds. No murmur heard.    No friction rub.  Pulmonary:     Effort: Pulmonary effort is normal.     Breath sounds: Normal breath sounds. No wheezing or rales.  Abdominal:     General: Bowel sounds are normal. There is no distension.     Palpations: Abdomen is soft.     Tenderness: There is no abdominal tenderness. There is no guarding or rebound.  Musculoskeletal:        General: No tenderness. Normal range of motion.  Right lower leg: No edema.     Left lower leg: No edema.     Comments: No edema  Skin:    General: Skin is warm and dry.     Findings: No rash.  Neurological:     Mental Status: She is alert and oriented to person, place, and time. Mental status is at baseline.     Cranial Nerves: No cranial nerve deficit.     Sensory: No sensory deficit.     Motor: No weakness.     Gait: Gait normal.  Psychiatric:        Behavior: Behavior normal.     ED Results / Procedures / Treatments   Labs (all labs ordered are listed, but only abnormal results are displayed) Labs Reviewed  CBC - Abnormal; Notable for the following components:      Result Value   Platelets 473 (*)    All other components within normal limits  BASIC METABOLIC PANEL - Abnormal; Notable for the following components:   Potassium 3.3 (*)    All other components within normal limits  URINALYSIS, ROUTINE W REFLEX MICROSCOPIC -  Abnormal; Notable for the following components:   APPearance HAZY (*)    Hgb urine dipstick SMALL (*)    Bacteria, UA FEW (*)    All other components within normal limits  TSH - Abnormal; Notable for the following components:   TSH 6.120 (*)    All other components within normal limits  PREGNANCY, URINE  T4    EKG EKG Interpretation Date/Time:  Wednesday May 09 2023 03:46:59 EDT Ventricular Rate:  88 PR Interval:  163 QRS Duration:  98 QT Interval:  357 QTC Calculation: 432 R Axis:   82  Text Interpretation: Sinus rhythm Confirmed by Nicanor Alcon, April ([id number]) on 05/09/2023 3:49:19 AM  Radiology No results found.  Procedures Procedures    Medications Ordered in ED Medications  lactated ringers bolus 1,000 mL (1,000 mLs Intravenous New Bag/Given 05/09/23 1035)    ED Course/ Medical Decision Making/ A&P                                 Medical Decision Making Amount and/or Complexity of Data Reviewed Labs: ordered. Decision-making details documented in ED Course. ECG/medicine tests: ordered and independent interpretation performed. Decision-making details documented in ED Course.   Pt presenting today with a complaint that caries a high risk for morbidity and mortality.  Here today with symptoms of near syncope.  She is not describing symptoms of vertigo and denies any headache or concern for acute intracranial pathology based on exam.  She has no infectious symptoms and is well-appearing.  Patient's blood pressure is on the lower side 90/60 with her lowest blood pressure but she is asymptomatic with this but does have prior history of syncope and sometimes feels near syncopal when bending over and standing up.  No history of eating disorder she eats and drinks regularly.  Lower suspicion for anemia at this time.  I independently interpreted patient's EKG and labs and there is no evidence of dysrhythmia or abnormality of her EKG.  CBC with normal hemoglobin and white count, BMP  with minimally decreased potassium of 3.3 but otherwise normal.  Patient is not pregnant.  No indication for further testing at this time.  Patient is well-appearing and appears stable for discharge.  Will check thyroid studies and patient will follow-up with her PCP.  She is  comfortable with this plan  11:56 AM Prior to discharge patient's blood pressure dropped to the 80s while she was sleeping.  She had denied dizziness to me but told the nurse she felt dizzy.  Patient given 1 L of fluid.  When I went back to evaluate the patient blood pressure is 93/63 and she denies feeling dizzy at this time.  TSH did return mildly elevated at 6.12.  T4 is pending.  These findings were discussed with the patient and encouraged that she would need to follow-up with her PCP about these results.         Final Clinical Impression(s) / ED Diagnoses Final diagnoses:  Near syncope    Rx / DC Orders ED Discharge Orders     None         Gwyneth Sprout, MD 05/09/23 1157

## 2023-05-09 NOTE — Discharge Instructions (Addendum)
Make sure you are staying hydrated eating regularly.  If you have these feelings it is best if you lay down.  However if these become recurrent it might not be a bad idea to get a heart monitor to make sure this is not an abnormal heart rhythm.  Make sure you eat some bananas, oranges or some coconut water as your potassium is slightly low.

## 2023-05-09 NOTE — ED Notes (Signed)
Pts b/p low during discharge and pt stated she felt dizzy. Informed provider.

## 2023-05-09 NOTE — ED Notes (Signed)
Pt discharged home. Discharge information discussed. Educated pt on foods high in potassium to consume. Educated pt what to do if dizziness occurs. No s/s of distress observed during discharge.

## 2023-05-09 NOTE — ED Notes (Signed)
Pt discharged home. Discharge information discussed. No s/s of distress observed during discharge. 

## 2023-05-15 ENCOUNTER — Emergency Department (HOSPITAL_COMMUNITY): Payer: Commercial Managed Care - HMO

## 2023-05-15 ENCOUNTER — Encounter (HOSPITAL_COMMUNITY): Payer: Self-pay

## 2023-05-15 ENCOUNTER — Other Ambulatory Visit: Payer: Self-pay

## 2023-05-15 ENCOUNTER — Emergency Department (HOSPITAL_COMMUNITY)
Admission: EM | Admit: 2023-05-15 | Discharge: 2023-05-15 | Disposition: A | Discharge status: Home / Self Care | Payer: Commercial Managed Care - HMO | Attending: Emergency Medicine | Admitting: Emergency Medicine

## 2023-05-15 DIAGNOSIS — R55 Syncope and collapse: Secondary | ICD-10-CM | POA: Insufficient documentation

## 2023-05-15 DIAGNOSIS — R42 Dizziness and giddiness: Secondary | ICD-10-CM | POA: Diagnosis not present

## 2023-05-15 DIAGNOSIS — E876 Hypokalemia: Secondary | ICD-10-CM | POA: Diagnosis not present

## 2023-05-15 LAB — URINALYSIS, ROUTINE W REFLEX MICROSCOPIC
Bilirubin Urine: NEGATIVE
Glucose, UA: NEGATIVE mg/dL
Hgb urine dipstick: NEGATIVE
Ketones, ur: NEGATIVE mg/dL
Leukocytes,Ua: NEGATIVE
Nitrite: NEGATIVE
Protein, ur: NEGATIVE mg/dL
Specific Gravity, Urine: 1.005 (ref 1.005–1.030)
pH: 6 (ref 5.0–8.0)

## 2023-05-15 LAB — HEPATIC FUNCTION PANEL
ALT: 16 U/L (ref 0–44)
AST: 17 U/L (ref 15–41)
Albumin: 3.8 g/dL (ref 3.5–5.0)
Alkaline Phosphatase: 51 U/L (ref 38–126)
Bilirubin, Direct: <0.1 mg/dL (ref 0.0–0.2)
Total Bilirubin: 0.2 mg/dL — ABNORMAL LOW (ref 0.3–1.2)
Total Protein: 7.4 g/dL (ref 6.5–8.1)

## 2023-05-15 LAB — BASIC METABOLIC PANEL
Anion gap: 10 (ref 5–15)
BUN: 10 mg/dL (ref 6–20)
CO2: 19 mmol/L — ABNORMAL LOW (ref 22–32)
Calcium: 9.1 mg/dL (ref 8.9–10.3)
Chloride: 105 mmol/L (ref 98–111)
Creatinine, Ser: 1.08 mg/dL — ABNORMAL HIGH (ref 0.44–1.00)
GFR, Estimated: >60 mL/min (ref >60)
Glucose, Bld: 123 mg/dL — ABNORMAL HIGH (ref 70–99)
Potassium: 3.4 mmol/L — ABNORMAL LOW (ref 3.5–5.1)
Sodium: 134 mmol/L — ABNORMAL LOW (ref 135–145)

## 2023-05-15 LAB — CBC
HCT: 41.5 % (ref 36.0–46.0)
Hemoglobin: 14 g/dL (ref 12.0–15.0)
MCH: 31.5 pg (ref 26.0–34.0)
MCHC: 33.7 g/dL (ref 30.0–36.0)
MCV: 93.3 fL (ref 80.0–100.0)
Platelets: 415 10*3/uL — ABNORMAL HIGH (ref 150–400)
RBC: 4.45 MIL/uL (ref 3.87–5.11)
RDW: 11.7 % (ref 11.5–15.5)
WBC: 11.8 10*3/uL — ABNORMAL HIGH (ref 4.0–10.5)
nRBC: 0 % (ref 0.0–0.2)

## 2023-05-15 LAB — HCG, SERUM, QUALITATIVE: Preg, Serum: NEGATIVE

## 2023-05-15 LAB — CK: Total CK: 73 U/L (ref 38–234)

## 2023-05-15 MED ORDER — POTASSIUM CHLORIDE CRYS ER 20 MEQ PO TBCR: 20.0000 meq | EXTENDED_RELEASE_TABLET | Freq: Every day | ORAL | 0 refills | Status: DC

## 2023-05-15 MED ORDER — SODIUM CHLORIDE 0.9 % IV BOLUS
1000.0000 mL | Freq: Once | INTRAVENOUS | Status: AC
  Administered 2023-05-15: 1000 mL via INTRAVENOUS

## 2023-05-15 NOTE — ED Provider Notes (Signed)
[place] EMERGENCY DEPARTMENT AT [facility] Provider Note   CSN: [id number] Arrival date & time: 05/15/23  1659     History  Chief Complaint  Patient presents with   Dizziness    Bonnie Turner is a 37 y.o. female here presenting with dizziness.  Patient states that she has been having dizziness for several weeks now.  Patient has been seen in the ED recently and was told that her potassium was low.  Patient also has intermittent headaches.  Patient saw PCP and was referred to a neurology outpatient.  Patient states that today she finished workout and while working out, she had an episode of dizziness and then she had a panic attack.  Patient states that she is feeling better now.  Denies any chest pain or shortness of breath.  The history is provided by the patient.       Home Medications Prior to Admission medications   Medication Sig Start Date End Date Taking? Authorizing Provider  norethindrone-ethinyl estradiol-FE (LOESTRIN FE) 1-20 MG-MCG tablet Take 1 tablet by mouth daily.   Yes [provider]  ondansetron (ZOFRAN) 8 MG tablet Take 8 mg by mouth daily.   Yes [provider]  ipratropium (ATROVENT) 0.06 % nasal spray Place 2 sprays into both nostrils 4 (four) times daily. Patient not taking: Reported on 04/27/2018 06/15/17 06/07/19  Belinda Fisher, PA-C      Allergies    Hepatitis b virus vaccines    Review of Systems   Review of Systems  Neurological:  Positive for dizziness.  All other systems reviewed and are negative.   Physical Exam Updated Vital Signs BP 108/67   Pulse 77   Temp 98 F (36.7 C) (Oral)   Resp 16   Ht 5\' 4"  (1.626 m)   Wt 72.6 kg   LMP 04/25/2023 (Approximate)   SpO2 100%   BMI 27.46 kg/m  Physical Exam Vitals and nursing note reviewed.  HENT:     Head: Normocephalic.     Nose: Nose normal.     Mouth/Throat:     Mouth: Mucous membranes are moist.  Eyes:     Pupils: Pupils are equal, round, and reactive  to light.  Cardiovascular:     Rate and Rhythm: Normal rate and regular rhythm.     Pulses: Normal pulses.     Heart sounds: Normal heart sounds.  Pulmonary:     Effort: Pulmonary effort is normal.     Breath sounds: Normal breath sounds.  Abdominal:     General: Abdomen is flat.     Palpations: Abdomen is soft.  Musculoskeletal:        General: Normal range of motion.     Cervical back: Normal range of motion and neck supple.  Skin:    General: Skin is warm.     Capillary Refill: Capillary refill takes less than 2 seconds.  Neurological:     General: No focal deficit present.     Mental Status: She is alert and oriented to person, place, and time.     Comments: Patient has no obvious facial droop.  Normal strength bilateral arms and legs.  Normal finger-to-nose bilaterally  Psychiatric:        Mood and Affect: Mood normal.        Behavior: Behavior normal.     ED Results / Procedures / Treatments   Labs (all labs ordered are listed, but only abnormal results are displayed) Labs Reviewed  BASIC  METABOLIC PANEL - Abnormal; Notable for the following components:      Result Value   Sodium 134 (*)    Potassium 3.4 (*)    CO2 19 (*)    Glucose, Bld 123 (*)    Creatinine, Ser 1.08 (*)    All other components within normal limits  CBC - Abnormal; Notable for the following components:   WBC 11.8 (*)    Platelets 415 (*)    All other components within normal limits  URINALYSIS, ROUTINE W REFLEX MICROSCOPIC - Abnormal; Notable for the following components:   Color, Urine STRAW (*)    Bacteria, UA MANY (*)    All other components within normal limits  HEPATIC FUNCTION PANEL - Abnormal; Notable for the following components:   Total Bilirubin 0.2 (*)    All other components within normal limits  HCG, SERUM, QUALITATIVE  CK  CBG MONITORING, ED    EKG EKG Interpretation Date/Time:  Tuesday May 15 2023 16:46:07 EDT Ventricular Rate:  130 PR Interval:  150 QRS  Duration:  78 QT Interval:  298 QTC Calculation: 438 R Axis:   79  Text Interpretation: Sinus tachycardia Cannot rule out Anterior infarct , age undetermined Abnormal ECG When compared with ECG of 09-May-2023 08:29, PREVIOUS ECG IS PRESENT Confirmed by Richardean Canal [phone]) on 05/15/2023 7:08:45 PM  Radiology No results found.  Procedures Procedures    Medications Ordered in ED Medications  sodium chloride 0.9 % bolus 1,000 mL (0 mLs Intravenous Stopped 05/15/23 2010)    ED Course/ Medical Decision Making/ A&P                                 Medical Decision Making Bonnie Turner is a 37 y.o. female here with dizziness and headaches.  Reviewed patient's recent ED visit.  Consider electrolyte abnormalities such as hypokalemia.  Also consider rhabdo as well.  Since she has been having headaches, consider brain mass as well.  Plan to get CT head and check electrolytes and hydrate patient and reassess.  9:02 PM Reviewed patient's labs and potassium is 3.4.  CK levels normal.  CT head unremarkable.  Patient states that she is eating normally.  Will prescribe potassium tablets to keep her potassium on the high end of normal. Will have her follow up with PCP and neurology   Problems Addressed: Dizziness: acute illness or injury Hypokalemia: acute illness or injury Near syncope: acute illness or injury  Amount and/or Complexity of Data Reviewed Labs: ordered. Decision-making details documented in ED Course. Radiology: ordered and independent interpretation performed. Decision-making details documented in ED Course.    Final Clinical Impression(s) / ED Diagnoses Final diagnoses:  None    Rx / DC Orders ED Discharge Orders     None         Charlynne Pander, MD 05/15/23 2103

## 2023-05-15 NOTE — ED Triage Notes (Signed)
Pt reports she feels dizzy like she has to pass out. She says she has had low potassium before which resulted in a similar presentation. She reports her body is shaking and she also has a headache.

## 2023-05-15 NOTE — Discharge Instructions (Addendum)
As we discussed, your potassium is slightly low  I recommend you take potassium 20 meq daily for the next 5 days.  You need to have your potassium rechecked with your doctor in a week  Stay hydrated.  See your doctor for follow-up  You should also have your doctor refer you to a neurologist  Return to ER if you have worse dizziness, passing out, chest pain

## 2023-06-18 ENCOUNTER — Other Ambulatory Visit: Payer: Self-pay

## 2023-06-18 ENCOUNTER — Emergency Department (HOSPITAL_COMMUNITY)
Admission: EM | Admit: 2023-06-18 | Discharge: 2023-06-19 | Disposition: A | Discharge status: Home / Self Care | Payer: Commercial Managed Care - HMO | Attending: Emergency Medicine | Admitting: Emergency Medicine

## 2023-06-18 ENCOUNTER — Encounter (HOSPITAL_COMMUNITY): Payer: Self-pay

## 2023-06-18 ENCOUNTER — Emergency Department (HOSPITAL_COMMUNITY): Payer: Commercial Managed Care - HMO

## 2023-06-18 DIAGNOSIS — R7309 Other abnormal glucose: Secondary | ICD-10-CM | POA: Insufficient documentation

## 2023-06-18 DIAGNOSIS — R569 Unspecified convulsions: Secondary | ICD-10-CM

## 2023-06-18 DIAGNOSIS — R000 Tachycardia, unspecified: Secondary | ICD-10-CM | POA: Diagnosis not present

## 2023-06-18 DIAGNOSIS — R258 Other abnormal involuntary movements: Secondary | ICD-10-CM | POA: Insufficient documentation

## 2023-06-18 DIAGNOSIS — E871 Hypo-osmolality and hyponatremia: Secondary | ICD-10-CM | POA: Insufficient documentation

## 2023-06-18 HISTORY — DX: Panic disorder (episodic paroxysmal anxiety): F41.0

## 2023-06-18 HISTORY — DX: Anxiety disorder, unspecified: F41.9

## 2023-06-18 LAB — COMPREHENSIVE METABOLIC PANEL
ALT: 16 U/L (ref 0–44)
AST: 17 U/L (ref 15–41)
Albumin: 4 g/dL (ref 3.5–5.0)
Alkaline Phosphatase: 56 U/L (ref 38–126)
Anion gap: 10 (ref 5–15)
BUN: 10 mg/dL (ref 6–20)
CO2: 21 mmol/L — ABNORMAL LOW (ref 22–32)
Calcium: 9 mg/dL (ref 8.9–10.3)
Chloride: 103 mmol/L (ref 98–111)
Creatinine, Ser: 1 mg/dL (ref 0.44–1.00)
GFR, Estimated: >60 mL/min (ref >60)
Glucose, Bld: 116 mg/dL — ABNORMAL HIGH (ref 70–99)
Potassium: 3.5 mmol/L (ref 3.5–5.1)
Sodium: 134 mmol/L — ABNORMAL LOW (ref 135–145)
Total Bilirubin: 0.5 mg/dL (ref 0.3–1.2)
Total Protein: 7.8 g/dL (ref 6.5–8.1)

## 2023-06-18 LAB — CBC WITH DIFFERENTIAL/PLATELET
Abs Immature Granulocytes: 0.01 10*3/uL (ref 0.00–0.07)
Basophils Absolute: 0 10*3/uL (ref 0.0–0.1)
Basophils Relative: 0 %
Eosinophils Absolute: 0.1 10*3/uL (ref 0.0–0.5)
Eosinophils Relative: 1 %
HCT: 39.7 % (ref 36.0–46.0)
Hemoglobin: 13.3 g/dL (ref 12.0–15.0)
Immature Granulocytes: 0 %
Lymphocytes Relative: 24 %
Lymphs Abs: 1.9 10*3/uL (ref 0.7–4.0)
MCH: 31 pg (ref 26.0–34.0)
MCHC: 33.5 g/dL (ref 30.0–36.0)
MCV: 92.5 fL (ref 80.0–100.0)
Monocytes Absolute: 0.3 10*3/uL (ref 0.1–1.0)
Monocytes Relative: 4 %
Neutro Abs: 5.6 10*3/uL (ref 1.7–7.7)
Neutrophils Relative %: 71 %
Platelets: 457 10*3/uL — ABNORMAL HIGH (ref 150–400)
RBC: 4.29 MIL/uL (ref 3.87–5.11)
RDW: 11.8 % (ref 11.5–15.5)
WBC: 8 10*3/uL (ref 4.0–10.5)
nRBC: 0 % (ref 0.0–0.2)

## 2023-06-18 NOTE — ED Triage Notes (Signed)
Patient BIB [facility] from the [facility]. Began shaking and hyperventilating. Felt anxious. Tingling and numbness in all extremities. Has a headache.

## 2023-06-19 DIAGNOSIS — R258 Other abnormal involuntary movements: Secondary | ICD-10-CM | POA: Diagnosis not present

## 2023-06-19 LAB — ETHANOL: Alcohol, Ethyl (B): <10 mg/dL (ref <10)

## 2023-06-19 LAB — TROPONIN I (HIGH SENSITIVITY): Troponin I (High Sensitivity): 2 ng/L (ref <18)

## 2023-06-19 LAB — HCG, SERUM, QUALITATIVE: Preg, Serum: NEGATIVE

## 2023-06-19 LAB — D-DIMER, QUANTITATIVE: D-Dimer, Quant: <0.27 ug{FEU}/mL (ref 0.00–0.50)

## 2023-06-19 NOTE — ED Notes (Signed)
Okay to discontinue 2nd trop order per provider

## 2023-06-19 NOTE — ED Provider Notes (Signed)
[place] EMERGENCY DEPARTMENT AT [facility] Provider Note   CSN: [id number] Arrival date & time: 06/18/23  1653     History  Chief Complaint  Patient presents with   Anxiety    Bonnie Turner is a 37 y.o. female.  HPI   Patient with medical history including anxiety, panic disorder, depression, presenting with complaints of generalized shaking.  Patient states that she was getting her nails done and while she was sitting there she started to feel severe pain all throughout her body, she then was unable to move and then had full body shaking.  She states she never lost consciousness, she could hear and understand in 1 but just could not move.  She did not bite her tongue or become incontinent, she has no seizure history.  Patient states that this has happened to her before has been on for last 2 months but now is becoming more frequent, she states when this all happened she did have some chest pain and shortness of breath, no history PEs or DVTs she is on oral birth control no recent surgeries no long immobilizations.  Reviewed patient's chart has been seen in the past for near syncope, has had a benign workup and was discharged home.  Home Medications Prior to Admission medications   Medication Sig Start Date End Date Taking? Authorizing Provider  norethindrone-ethinyl estradiol-FE (LOESTRIN FE) 1-20 MG-MCG tablet Take 1 tablet by mouth daily.    [provider]  ondansetron (ZOFRAN) 8 MG tablet Take 8 mg by mouth daily.    [provider]  potassium chloride SA (KLOR-CON M) 20 MEQ tablet Take 1 tablet (20 mEq total) by mouth daily. 05/15/23   Charlynne Pander, MD  ipratropium (ATROVENT) 0.06 % nasal spray Place 2 sprays into both nostrils 4 (four) times daily. Patient not taking: Reported on 04/27/2018 06/15/17 06/07/19  Belinda Fisher, PA-C      Allergies    Hepatitis b virus vaccines    Review of Systems   Review of Systems  Constitutional:  Negative  for chills and fever.  Respiratory:  Negative for shortness of breath.   Cardiovascular:  Negative for chest pain.  Gastrointestinal:  Negative for abdominal pain.  Musculoskeletal:  Positive for myalgias.  Neurological:  Negative for headaches.    Physical Exam Updated Vital Signs BP 114/75   Pulse (!) 110   Temp 98.3 F (36.8 C)   Resp 12   Ht 5\' 3"  (1.6 m)   Wt 77.1 kg   LMP 06/07/2023   SpO2 99%   BMI 30.11 kg/m  Physical Exam Vitals and nursing note reviewed.  Constitutional:      General: She is not in acute distress.    Appearance: She is not ill-appearing.  HENT:     Head: Normocephalic and atraumatic.     Nose: No congestion.  Eyes:     Conjunctiva/sclera: Conjunctivae normal.  Cardiovascular:     Rate and Rhythm: Regular rhythm. Tachycardia present.     Pulses: Normal pulses.     Heart sounds: No murmur heard.    No friction rub. No gallop.  Pulmonary:     Effort: No respiratory distress.     Breath sounds: No wheezing, rhonchi or rales.  Musculoskeletal:     Right lower leg: No edema.     Left lower leg: No edema.     Comments: No unilateral leg swelling no calf tenderness no palpable cords.  Skin:  General: Skin is warm and dry.  Neurological:     Mental Status: She is alert.     GCS: GCS eye subscore is 4. GCS verbal subscore is 5. GCS motor subscore is 6.     Cranial Nerves: Cranial nerves 2-12 are intact.     Sensory: Sensation is intact.     Motor: No weakness.     Coordination: Romberg sign negative. Finger-Nose-Finger Test normal.     Comments: Cranial nerves II through XII grossly intact no difficulty with word finding following she is up commands there is no unilateral weakness present.  Psychiatric:        Mood and Affect: Mood normal.     ED Results / Procedures / Treatments   Labs (all labs ordered are listed, but only abnormal results are displayed) Labs Reviewed  COMPREHENSIVE METABOLIC PANEL - Abnormal; Notable for the  following components:      Result Value   Sodium 134 (*)    CO2 21 (*)    Glucose, Bld 116 (*)    All other components within normal limits  CBC WITH DIFFERENTIAL/PLATELET - Abnormal; Notable for the following components:   Platelets 457 (*)    All other components within normal limits  ETHANOL  D-DIMER, QUANTITATIVE  HCG, SERUM, QUALITATIVE  RAPID URINE DRUG SCREEN, HOSP PERFORMED  TROPONIN I (HIGH SENSITIVITY)    EKG EKG Interpretation Date/Time:  Tuesday June 19 2023 01:40:41 EDT Ventricular Rate:  96 PR Interval:  168 QRS Duration:  88 QT Interval:  348 QTC Calculation: 440 R Axis:   65  Text Interpretation: Sinus rhythm Low voltage, precordial leads Confirmed by Tilden Fossa [phone]) on 06/19/2023 1:47:00 AM  Radiology DG Chest 2 View  Result Date: 06/19/2023 CLINICAL DATA:  Anxiety and chest tightness. EXAM: CHEST - 2 VIEW COMPARISON:  April 03, 2022 FINDINGS: The heart size and mediastinal contours are within normal limits. Both lungs are clear. The visualized skeletal structures are unremarkable. IMPRESSION: No active cardiopulmonary disease. Electronically Signed   By: Aram Candela M.D.   On: 06/19/2023 00:34    Procedures Procedures    Medications Ordered in ED Medications - No data to display  ED Course/ Medical Decision Making/ A&P                                 Medical Decision Making Amount and/or Complexity of Data Reviewed Labs: ordered. Radiology: ordered.   This patient presents to the ED for concern of shaking, this involves an extensive number of treatment options, and is a complaint that carries with it a high risk of complications and morbidity.  The differential diagnosis includes seizures, metabolic abnormality, PE, ACS,    Additional history obtained:  Additional history obtained from mother at bedside External records from outside source obtained and reviewed including recent ER notes   Co morbidities that complicate the  patient evaluation  Anxiety  Social Determinants of Health:  N/A    Lab Tests:  I Ordered, and personally interpreted labs.  The pertinent results include: CBC is unremarkable, CMP reveals sodium 134 CO2 of 21 glucose 116, ethanol less than 10, D-dimer negative, for troponin is negative, hCG negative   Imaging Studies ordered:  I ordered imaging studies including chest x-ray I independently visualized and interpreted imaging which showed unremarkable  Cardiac Monitoring:  The patient was maintained on a cardiac monitor.  I personally viewed and interpreted the cardiac  monitored which showed an underlying rhythm of: Without signs of ischemia   Medicines ordered and prescription drug management:  I ordered medication including N/A I have reviewed the patients home medicines and have made adjustments as needed  Critical Interventions:  N/A   Reevaluation:  Presents with episodes of shaking, triage obtain basic lab workup which I personally reviewed, is unremarkable, she was tachycardic during my examination, she had no focal deficits, she did endorse some chest pain and shortness of breath, will obtain troponins, chest x-ray, EKG, as well as D-dimer for further assessment  Reassessed resting comfortably no longer tachycardic without any intervention, agreement with discharge at this time.  Consultations Obtained:  N/a    Test Considered:  CT head-deferred as my suspicion for intracranial mass very low at this time had CT imaging performed last month ago which unremarkable.    Rule out low suspicion for internal head bleed and or mass as CT imaging is negative for acute findings.  Low suspicion for CVA she has no focal deficit present my exam.  Low suspicion for dissection of the vertebral or carotid artery as presentation atypical of etiology.  Low suspicion for meningitis as she has no meningeal sign present.  Suspicion for seizure is also low at this time  presentation atypical no actual loss of consciousness, no postictal state, no urinary or bowel incontinence I have low suspicion for ACS as history is atypical, patient has no cardiac history, EKG was sinus rhythm without signs of ischemia, patient had a negative troponin.  Low suspicion for PE D-dimer is negative presentation atypical nontachypneic nonhypoxic, she was tachycardic but this is since resolved, I suspect secondary due to anxiety.  Low suspicion for AAA or aortic dissection as history is atypical, patient has low risk factors.  Low suspicion for systemic infection as patient is nontoxic-appearing, vital signs reassuring, no obvious source infection noted on exam.      Dispostion and problem list  After consideration of the diagnostic results and the patients response to treatment, I feel that the patent would benefit from discharge.  Seizure-like activity-suspect multifactorial, I suspect underlying anxiety/panic attacks, as well as nonepileptic seizures, would recommend that she follows up with neurology for further evaluation.  Provided with seizure precautions.            Final Clinical Impression(s) / ED Diagnoses Final diagnoses:  Seizure-like activity (HCC)    Rx / DC Orders ED Discharge Orders          Ordered    Ambulatory referral to Neurology       Comments: An appointment is requested in approximately: 1 week   06/19/23 0206              Carroll Sage, PA-C 06/19/23 2725    Tilden Fossa, MD 06/19/23 [phone]

## 2023-06-19 NOTE — Discharge Instructions (Signed)
Your lab workup and imaging are reassuring, it is possible that you could have had a seizure.  I have sent in a internal referral to neurology they should contact you within a week's time for further follow-up.  If you do not hear from them within that time.  Please contact their office.  Per [facility] law you are not supposed to drive for the next 6 months and are  are seizure-free, I also recommend not swimming is having a seizure while in the water could result in death.   Come back to the emergency department if you develop chest pain, shortness of breath, severe abdominal pain, uncontrolled nausea, vomiting, diarrhea.
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
## 2024-10-07 NOTE — Progress Notes (Addendum)
 Subjective Patient ID: Bonnie Turner is a 38 y.o. female.    Patient here today for a rash. States she first noticed some irritation in the vaginal area with a rag in the shower two days ago. Did not notice anything there. Last night she had the same irritation and when she looked after she had some bumps on the left labia. No recent changes to anything.    Rash Pertinent negatives include no fever, sore throat or vomiting.    Review of Systems  Constitutional:  Negative for chills, diaphoresis and fever.  HENT:  Negative for mouth sores, sore throat and trouble swallowing.   Eyes:  Negative for discharge and redness.  Gastrointestinal:  Negative for abdominal pain, nausea and vomiting.  Genitourinary:  Positive for genital sores. Negative for decreased urine volume, difficulty urinating, dysuria, flank pain, frequency, hematuria, urgency, vaginal bleeding, vaginal discharge and vaginal pain.  Musculoskeletal:  Negative for arthralgias and myalgias.  Skin:  Positive for rash.  Neurological:  Negative for headaches.  Hematological:  Negative for adenopathy.  Psychiatric/Behavioral:  The patient is not nervous/anxious.     Patient History  Allergies: Allergies  Allergen Reactions   Hepatitis B Virus Vaccines Hives and Swelling    AT INJECTION SITE    History reviewed. No pertinent past medical history. Past Surgical History:  Procedure Laterality Date   COSMETIC SURGERY  2023   bbl   Social History   Socioeconomic History   Marital status: Not on file    Spouse name: Not on file   Number of children: Not on file   Years of education: Not on file   Highest education level: Not on file  Occupational History   Not on file  Tobacco Use   Smoking status: Never    Passive exposure: Never   Smokeless tobacco: Never  Vaping Use   Vaping status: Some Days  Substance and Sexual Activity   Alcohol use: Yes   Drug use: Never   Sexual activity: Yes     Partners: Male    Birth control/protection: None  Other Topics Concern   Not on file  Social History Narrative   Not on file   Family History  Problem Relation Name Age of Onset   No Known Problems Mother     No Known Problems Father     No Known Problems Sister     No Known Problems Brother     Current Outpatient Medications on File Prior to Visit  Medication Sig Dispense Refill   fluconazole (Diflucan) 150 MG tablet Take 1 tab PO x1, may repeat q72h x2 doses (Patient not taking: Reported on 10/01/2024) 2 tablet 0   fluconazole (Diflucan) 150 MG tablet Take 1 tablet PO on day 1 and on day 3 2 tablet 0   metroNIDAZOLE (Flagyl) 500 MG tablet Take 1 tablet (500 mg total) by mouth every 12 (twelve) hours for 7 days. 14 tablet 1   No current facility-administered medications on file prior to visit.    Objective  Vitals:   10/07/24 1027  BP: 121/88  BP Location: Left arm  Patient Position: Sitting  Pulse: (!) 112  Resp: 16  Temp: 36.6 C (97.9 F)  TempSrc: Oral  SpO2: 96%  Weight: 80.3 kg  Height: 5' 3  PainSc: 0-No pain  LMP date is Unknown     OBGYN/Pregnancy Status: Having periods      No results found.  Physical Exam Vitals and nursing note reviewed.  Constitutional:  Appearance: She is normal weight.  HENT:     Head: Normocephalic.  Eyes:     Conjunctiva/sclera: Conjunctivae normal.  Cardiovascular:     Rate and Rhythm: Regular rhythm. Tachycardia present.     Heart sounds: Normal heart sounds.  Pulmonary:     Effort: Pulmonary effort is normal.     Breath sounds: Normal breath sounds.  Abdominal:     General: Abdomen is flat. Bowel sounds are normal.  Genitourinary:     Comments: Inflamed left labia. Three genital ulcers in above spot. Ttp. Skin:    General: Skin is warm and dry.  Neurological:     Mental Status: She is alert.     Results for orders placed or performed in visit on 10/07/24  POCT urinalysis dipstick manually  resulted  Component Result   Color, UA Dark (A)   Clarity, UA Cloudy (A)   Glucose, UA Negative   Bilirubin, UA Detected (A)   Ketones, UA 2+ (A)   Spec Grav, UA >=1.030 (A)   POCT Blood UA Negative   pH, UA 6.0   Protein, UA Trace (A)   Urobilinogen, UA Negative   Leukocytes, UA 3+ (A)   Nitrite, UA Negative  POCT PREGNANCY  Component Result   Preg Test, Ur Negative   Internal Quality Control Pass       Procedures MDM:     1 Acute, uncomplicated illness or injury     Unique ordered tests: Two     Unique ordered tests comment:  Hsv, ua, preg, urine c   Assessment requiring historian other than patient: No     Independent visualization of image, tracing, or test: No     Discussion of management with another provider: No     Risk:: Moderate            Assessment/Plan Diagnoses and all orders for this visit:  Vaginal sore -     POCT urinalysis dipstick manually resulted -     POCT PREGNANCY -     HSV Culture and Typing -     valACYclovir (Valtrex) 1 g tablet; Take 1 tablet (1,000 mg total) by mouth in the morning and 1 tablet (1,000 mg total) in the evening. Do all this for 7 days.  Abnormal urine -     Urine Culture, Routine     Disposition Status: Home  Patient Instructions  abstain from sexual intercourse until labs come back, inform partners to be tested and treated.. If positive abstain from intercourse for 10 days from treatment date.  We have sent your labs to [facility] and results will take anywhere from 2-5 days. We will call you with the results positive or negative once we receive them. Can also sign onto MyChart for lab results once they are available.    Progress note signed by Leita Cahill, PA on 10/07/24 at 10:46 AM

## 2024-10-19 ENCOUNTER — Inpatient Hospital Stay (HOSPITAL_COMMUNITY)
Admission: AD | Admit: 2024-10-19 | Discharge: 2024-10-23 | DRG: 885 | Disposition: A | Discharge status: Home / Self Care | Source: Intra-hospital | Attending: Student in an Organized Health Care Education/Training Program | Admitting: Student in an Organized Health Care Education/Training Program

## 2024-10-19 ENCOUNTER — Encounter (HOSPITAL_COMMUNITY): Payer: Self-pay

## 2024-10-19 ENCOUNTER — Other Ambulatory Visit: Payer: Self-pay

## 2024-10-19 ENCOUNTER — Ambulatory Visit (HOSPITAL_COMMUNITY)
Admission: EM | Admit: 2024-10-19 | Discharge: 2024-10-19 | Disposition: A | Discharge status: Other Acute Inpatient Hospital

## 2024-10-19 DIAGNOSIS — Z79899 Other long term (current) drug therapy: Secondary | ICD-10-CM

## 2024-10-19 DIAGNOSIS — F332 Major depressive disorder, recurrent severe without psychotic features: Principal | ICD-10-CM | POA: Diagnosis present

## 2024-10-19 DIAGNOSIS — F4310 Post-traumatic stress disorder, unspecified: Secondary | ICD-10-CM | POA: Insufficient documentation

## 2024-10-19 DIAGNOSIS — B009 Herpesviral infection, unspecified: Secondary | ICD-10-CM | POA: Insufficient documentation

## 2024-10-19 DIAGNOSIS — R45851 Suicidal ideations: Secondary | ICD-10-CM | POA: Diagnosis present

## 2024-10-19 DIAGNOSIS — Z91148 Patient's other noncompliance with medication regimen for other reason: Secondary | ICD-10-CM | POA: Insufficient documentation

## 2024-10-19 DIAGNOSIS — F419 Anxiety disorder, unspecified: Secondary | ICD-10-CM | POA: Diagnosis present

## 2024-10-19 DIAGNOSIS — R45850 Homicidal ideations: Secondary | ICD-10-CM | POA: Diagnosis not present

## 2024-10-19 DIAGNOSIS — F422 Mixed obsessional thoughts and acts: Secondary | ICD-10-CM | POA: Diagnosis present

## 2024-10-19 DIAGNOSIS — F429 Obsessive-compulsive disorder, unspecified: Secondary | ICD-10-CM | POA: Diagnosis not present

## 2024-10-19 DIAGNOSIS — Z560 Unemployment, unspecified: Secondary | ICD-10-CM | POA: Diagnosis not present

## 2024-10-19 LAB — CBC WITH DIFFERENTIAL/PLATELET
Abs Immature Granulocytes: 0.01 K/uL (ref 0.00–0.07)
Basophils Absolute: 0 K/uL (ref 0.0–0.1)
Basophils Relative: 1 %
Eosinophils Absolute: 0.1 K/uL (ref 0.0–0.5)
Eosinophils Relative: 1 %
HCT: 44.4 % (ref 36.0–46.0)
Hemoglobin: 15.3 g/dL — ABNORMAL HIGH (ref 12.0–15.0)
Immature Granulocytes: 0 %
Lymphocytes Relative: 28 %
Lymphs Abs: 1.9 K/uL (ref 0.7–4.0)
MCH: 31.8 pg (ref 26.0–34.0)
MCHC: 34.5 g/dL (ref 30.0–36.0)
MCV: 92.3 fL (ref 80.0–100.0)
Monocytes Absolute: 0.4 K/uL (ref 0.1–1.0)
Monocytes Relative: 6 %
Neutro Abs: 4.3 K/uL (ref 1.7–7.7)
Neutrophils Relative %: 64 %
Platelets: 389 K/uL (ref 150–400)
RBC: 4.81 MIL/uL (ref 3.87–5.11)
RDW: 12 % (ref 11.5–15.5)
WBC: 6.8 K/uL (ref 4.0–10.5)
nRBC: 0 % (ref 0.0–0.2)

## 2024-10-19 LAB — URINALYSIS, ROUTINE W REFLEX MICROSCOPIC
Bilirubin Urine: NEGATIVE
Glucose, UA: NEGATIVE mg/dL
Hgb urine dipstick: NEGATIVE
Ketones, ur: NEGATIVE mg/dL
Leukocytes,Ua: NEGATIVE
Nitrite: NEGATIVE
Protein, ur: NEGATIVE mg/dL
Specific Gravity, Urine: 1.026 (ref 1.005–1.030)
pH: 7 (ref 5.0–8.0)

## 2024-10-19 LAB — POCT URINE DRUG SCREEN - MANUAL ENTRY (I-SCREEN)
POC Amphetamine UR: NOT DETECTED
POC Buprenorphine (BUP): NOT DETECTED
POC Cocaine UR: NOT DETECTED
POC Marijuana UR: NOT DETECTED
POC Methadone UR: NOT DETECTED
POC Methamphetamine UR: NOT DETECTED
POC Morphine: NOT DETECTED
POC Oxazepam (BZO): NOT DETECTED
POC Oxycodone UR: NOT DETECTED
POC Secobarbital (BAR): NOT DETECTED

## 2024-10-19 LAB — COMPREHENSIVE METABOLIC PANEL WITH GFR
ALT: 40 U/L (ref 0–44)
AST: 23 U/L (ref 15–41)
Albumin: 5.1 g/dL — ABNORMAL HIGH (ref 3.5–5.0)
Alkaline Phosphatase: 84 U/L (ref 38–126)
Anion gap: 13 (ref 5–15)
BUN: 8 mg/dL (ref 6–20)
CO2: 24 mmol/L (ref 22–32)
Calcium: 9.8 mg/dL (ref 8.9–10.3)
Chloride: 100 mmol/L (ref 98–111)
Creatinine, Ser: 1.06 mg/dL — ABNORMAL HIGH (ref 0.44–1.00)
GFR, Estimated: >60 mL/min
Glucose, Bld: 67 mg/dL — ABNORMAL LOW (ref 70–99)
Potassium: 4.2 mmol/L (ref 3.5–5.1)
Sodium: 137 mmol/L (ref 135–145)
Total Bilirubin: 0.3 mg/dL (ref 0.0–1.2)
Total Protein: 9.1 g/dL — ABNORMAL HIGH (ref 6.5–8.1)

## 2024-10-19 LAB — TSH: TSH: 1.46 u[IU]/mL (ref 0.350–4.500)

## 2024-10-19 LAB — ETHANOL: Alcohol, Ethyl (B): <15 mg/dL

## 2024-10-19 LAB — HEPATITIS PANEL, ACUTE
HCV Ab: NONREACTIVE
Hep A IgM: NONREACTIVE
Hep B C IgM: NONREACTIVE
Hepatitis B Surface Ag: NONREACTIVE

## 2024-10-19 LAB — VITAMIN D 25 HYDROXY (VIT D DEFICIENCY, FRACTURES): Vit D, 25-Hydroxy: 36.7 ng/mL (ref 30–100)

## 2024-10-19 LAB — VITAMIN B12: Vitamin B-12: 651 pg/mL (ref 180–914)

## 2024-10-19 LAB — POC URINE PREG, ED: Preg Test, Ur: NEGATIVE

## 2024-10-19 LAB — HIV ANTIBODY (ROUTINE TESTING W REFLEX): HIV Screen 4th Generation wRfx: NONREACTIVE

## 2024-10-19 MED ORDER — ACETAMINOPHEN 325 MG PO TABS: 650.0000 mg | ORAL_TABLET | Freq: Four times a day (QID) | ORAL | Status: DC | PRN

## 2024-10-19 MED ORDER — MAGNESIUM HYDROXIDE 400 MG/5ML PO SUSP: 30.0000 mL | Freq: Every day | ORAL | Status: DC | PRN

## 2024-10-19 MED ORDER — ALUM & MAG HYDROXIDE-SIMETH 200-200-20 MG/5ML PO SUSP: 30.0000 mL | ORAL | Status: DC | PRN

## 2024-10-19 MED ORDER — HALOPERIDOL 5 MG PO TABS: 5.0000 mg | ORAL_TABLET | Freq: Three times a day (TID) | ORAL | Status: DC | PRN

## 2024-10-19 MED ORDER — DIPHENHYDRAMINE HCL 50 MG/ML IJ SOLN: 50.0000 mg | Freq: Three times a day (TID) | INTRAMUSCULAR | Status: DC | PRN

## 2024-10-19 MED ORDER — LAMOTRIGINE 25 MG PO TABS
25.0000 mg | ORAL_TABLET | Freq: Every day | ORAL | Status: DC
  Administered 2024-10-20: 25 mg via ORAL
  Filled 2024-10-19: qty 1

## 2024-10-19 MED ORDER — HALOPERIDOL LACTATE 5 MG/ML IJ SOLN: 5.0000 mg | Freq: Three times a day (TID) | INTRAMUSCULAR | Status: DC | PRN

## 2024-10-19 MED ORDER — LAMOTRIGINE 25 MG PO TABS: 25.0000 mg | ORAL_TABLET | Freq: Every day | ORAL | Status: DC

## 2024-10-19 MED ORDER — LORAZEPAM 2 MG/ML IJ SOLN: 2.0000 mg | Freq: Three times a day (TID) | INTRAMUSCULAR | Status: DC | PRN

## 2024-10-19 MED ORDER — HALOPERIDOL LACTATE 5 MG/ML IJ SOLN: 10.0000 mg | Freq: Three times a day (TID) | INTRAMUSCULAR | Status: DC | PRN

## 2024-10-19 MED ORDER — DOXEPIN HCL 25 MG PO CAPS
50.0000 mg | ORAL_CAPSULE | Freq: Every day | ORAL | Status: DC
  Administered 2024-10-20 – 2024-10-22 (×3): 50 mg via ORAL
  Filled 2024-10-19 (×3): qty 2

## 2024-10-19 MED ORDER — FLUVOXAMINE MALEATE 50 MG PO TABS: 50.0000 mg | ORAL_TABLET | Freq: Every day | ORAL | Status: DC

## 2024-10-19 MED ORDER — FLUVOXAMINE MALEATE 50 MG PO TABS
50.0000 mg | ORAL_TABLET | Freq: Every day | ORAL | Status: DC
  Administered 2024-10-20: 50 mg via ORAL
  Filled 2024-10-19: qty 1

## 2024-10-19 MED ORDER — DIPHENHYDRAMINE HCL 50 MG PO CAPS: 50.0000 mg | ORAL_CAPSULE | Freq: Three times a day (TID) | ORAL | Status: DC | PRN

## 2024-10-19 MED ORDER — DIPHENHYDRAMINE HCL 25 MG PO CAPS: 50.0000 mg | ORAL_CAPSULE | Freq: Three times a day (TID) | ORAL | Status: DC | PRN

## 2024-10-19 MED ORDER — DOXEPIN HCL 25 MG PO CAPS
50.0000 mg | ORAL_CAPSULE | Freq: Every day | ORAL | Status: DC
  Administered 2024-10-19: 50 mg via ORAL
  Filled 2024-10-19: qty 2

## 2024-10-19 NOTE — ED Notes (Signed)
 Report called to Vertell Peak at [facility]
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
## 2024-10-19 NOTE — ED Notes (Signed)
 [facility] here to transport patient to [facility].

## 2024-10-19 NOTE — ED Notes (Signed)
 Patient is alert and oriented X 4. Patient has depressed mood, tearful at times. Patient states she feels safe here so she is not suicidal or homicidal at this time. Patient has no complaints. She states she has no appetite. Patient is calm and cooperative, will continue to monitor for safety
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
## 2024-10-19 NOTE — ED Notes (Signed)
 Patient eating snack at this time, blood sugar noted to be 67
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
## 2024-10-20 ENCOUNTER — Other Ambulatory Visit: Payer: Self-pay

## 2024-10-20 ENCOUNTER — Encounter (HOSPITAL_COMMUNITY): Payer: Self-pay

## 2024-10-20 DIAGNOSIS — F429 Obsessive-compulsive disorder, unspecified: Secondary | ICD-10-CM

## 2024-10-20 DIAGNOSIS — F332 Major depressive disorder, recurrent severe without psychotic features: Principal | ICD-10-CM

## 2024-10-20 DIAGNOSIS — F4310 Post-traumatic stress disorder, unspecified: Secondary | ICD-10-CM

## 2024-10-20 LAB — SYPHILIS: RPR W/REFLEX TO RPR TITER AND TREPONEMAL ANTIBODIES, TRADITIONAL SCREENING AND DIAGNOSIS ALGORITHM: RPR Ser Ql: NONREACTIVE

## 2024-10-20 LAB — GC/CHLAMYDIA PROBE AMP (~~LOC~~) NOT AT ARMC
Chlamydia: NEGATIVE
Comment: NEGATIVE
Comment: NORMAL
Neisseria Gonorrhea: NEGATIVE

## 2024-10-20 MED ORDER — ENSURE PLUS HIGH PROTEIN PO LIQD
237.0000 mL | Freq: Two times a day (BID) | ORAL | Status: DC
  Filled 2024-10-20 (×7): qty 237

## 2024-10-20 MED ORDER — FLUVOXAMINE MALEATE 50 MG PO TABS
75.0000 mg | ORAL_TABLET | Freq: Every day | ORAL | Status: DC
  Administered 2024-10-21 – 2024-10-23 (×3): 75 mg via ORAL
  Filled 2024-10-20 (×3): qty 2

## 2024-10-20 NOTE — BHH Suicide Risk Assessment (Signed)
 [facility] Admission Suicide Risk Assessment    Patient presents with acute risk factors for suicide including depressed mood, suicidal ideation and recent stressor. She carries additional risk factors of prior SI, one prior psychiatric admission, trauma, loss of support (breakup). Protective factors include help-seeking behavior, no previous suicide attempts, support of family, domiciled, no substance use concerns, future oriented and denying frank SI today (endorsing intrusive thoughts). Current risk of suicide is considered moderate  I certify that inpatient services furnished can reasonably be expected to improve the patient's condition.   Bonnie LOISE Arts, MD 10/20/2024, 1:40 PM
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
## 2024-10-20 NOTE — Group Note (Signed)
 Occupational Therapy Group Note  Group Topic: Sleep Hygiene  Group Date: 10/20/2024 Start Time: 1500 End Time: 1530 Facilitators: Dot [place] MATSU, OT   Group Description: Group encouraged increased participation and engagement through topic focused on sleep hygiene. Patients reflected on the quality of sleep they typically receive and identified areas that need improvement. Group was given background information on sleep and sleep hygiene, including common sleep disorders. Group members also received information on how to improve ones sleep and introduced a sleep diary as a tool that can be utilized to track sleep quality over a length of time. Group session ended with patients identifying one or more strategies they could utilize or implement into their sleep routine in order to improve overall sleep quality.        Therapeutic Goal(s):  Identify one or more strategies to improve overall sleep hygiene  Identify one or more areas of sleep that are negatively impacted (sleep too much, too little, etc)     Participation Level: Engaged   Participation Quality: Independent   Behavior: Appropriate   Speech/Thought Process: Relevant   Affect/Mood: Appropriate   Insight: Fair   Judgement: Fair      Modes of Intervention: Education  Patient Response to Interventions:  Attentive   Plan: Continue to engage patient in OT groups 2 - 3x/week.  10/20/2024  [place] MATSU Dot, OT  Bonnie Turner, OT

## 2024-10-20 NOTE — Tx Team (Signed)
 Initial Treatment Plan 10/20/2024 12:23 AM [name] FMW:[id number]    PATIENT STRESSORS: Health problems   Marital or family conflict   Traumatic event     PATIENT STRENGTHS: Average or above average intelligence  Capable of independent living  Communication skills  Physical Health  Supportive family/friends    PATIENT IDENTIFIED PROBLEMS: Depression  Suicidal Ideation         Unsure of goal           DISCHARGE CRITERIA:  Improved stabilization in mood, thinking, and/or behavior Motivation to continue treatment in a less acute level of care Need for constant or close observation no longer present Verbal commitment to aftercare and medication compliance  PRELIMINARY DISCHARGE PLAN: Outpatient therapy Return to previous living arrangement  PATIENT/FAMILY INVOLVEMENT: This treatment plan has been presented to and reviewed with the patient, Bonnie Turner.  The patient and family have been given the opportunity to ask questions and make suggestions.  Effie Naomie Norris, RN 10/20/2024, 12:23 AM

## 2024-10-20 NOTE — BHH Group Notes (Signed)
 Adult Psychoeducational Group Note  Date:  10/20/2024 Time:  5:25 PM  Group Topic/Focus: Occupational therapy  Participation Level:  Did Not Attend  Bonnie Turner 10/20/2024, 5:25 PM

## 2024-10-20 NOTE — Plan of Care (Signed)
   Problem: Education: Goal: Knowledge of [place] [facility] Education information/materials will improve Outcome: Progressing

## 2024-10-20 NOTE — Progress Notes (Signed)
" °   10/20/24 0011  Psych Admission Type (Psych Patients Only)  Admission Status Voluntary  Psychosocial Assessment  Patient Complaints Depression;Crying spells  Eye Contact Fair  Facial Expression Flat  Affect Appropriate to circumstance  Speech Logical/coherent  Interaction Assertive  Motor Activity Other (Comment) (WDL)  Appearance/Hygiene In scrubs  Behavior Characteristics Appropriate to situation  Mood Depressed;Ashamed/humiliated;Sad  Thought Process  Coherency WDL  Content WDL  Delusions WDL  Perception WDL  Hallucination None reported or observed  Judgment Impaired  Confusion None  Danger to Self  Current suicidal ideation? Denies  Agreement Not to Harm Self Yes  Description of Agreement verbal  Danger to Others  Danger to Others None reported or observed  Danger to Others Abnormal  Harmful Behavior to others No threats or harm toward other people  Destructive Behavior No threats or harm toward property    "

## 2024-10-20 NOTE — Plan of Care (Signed)
 Pt has spent most of the day in bed but did go to one group and acted appropriately in the milieu. Ate adequately for breakfast and dinner. Denies SI/HI/paranoia/AVH. Will continue to monitor.

## 2024-10-20 NOTE — BH IP Treatment Plan (Signed)
 Interdisciplinary Treatment and Diagnostic Plan Update  10/20/2024 Time of Session: 10:00 am Bonnie Turner MRN: [id number]  Principal Diagnosis: MDD (major depressive disorder), recurrent episode, severe (HCC)  Secondary Diagnoses: Principal Problem:   MDD (major depressive disorder), recurrent episode, severe (HCC) Active Problems:   PTSD (post-traumatic stress disorder)   Mixed obsessional thoughts and acts   Current Medications:  Current Facility-Administered Medications  Medication Dose Route Frequency Provider Last Rate Last Admin   acetaminophen  (TYLENOL ) tablet 650 mg  650 mg Oral Q6H PRN Gottfried, Rhoda J, MD       alum & mag hydroxide-simeth (MAALOX/MYLANTA) 200-200-20 MG/5ML suspension 30 mL  30 mL Oral Q4H PRN Gottfried, Rhoda J, MD       haloperidol  (HALDOL ) tablet 5 mg  5 mg Oral TID PRN Gottfried, Rhoda J, MD       And   diphenhydrAMINE  (BENADRYL ) capsule 50 mg  50 mg Oral TID PRN Gottfried, Rhoda J, MD       haloperidol  lactate (HALDOL ) injection 5 mg  5 mg Intramuscular TID PRN Lawrnce Garvin PARAS, MD       And   diphenhydrAMINE  (BENADRYL ) injection 50 mg  50 mg Intramuscular TID PRN Gottfried, Rhoda J, MD       And   LORazepam  (ATIVAN ) injection 2 mg  2 mg Intramuscular TID PRN Gottfried, Rhoda J, MD       haloperidol  lactate (HALDOL ) injection 10 mg  10 mg Intramuscular TID PRN Gottfried, Rhoda J, MD       And   diphenhydrAMINE  (BENADRYL ) injection 50 mg  50 mg Intramuscular TID PRN Gottfried, Rhoda J, MD       And   LORazepam  (ATIVAN ) injection 2 mg  2 mg Intramuscular TID PRN Gottfried, Rhoda J, MD       doxepin  (SINEQUAN ) capsule 50 mg  50 mg Oral QHS Gottfried, Rhoda J, MD       [START ON 10/21/2024] fluvoxaMINE  (LUVOX ) tablet 75 mg  75 mg Oral Daily Butler, Laura N, MD       magnesium  hydroxide (MILK OF MAGNESIA) suspension 30 mL  30 mL Oral Daily PRN Gottfried, Rhoda J, MD       PTA Medications: No medications prior to admission.    Patient  Stressors: Health problems   Marital or family conflict   Traumatic event    Patient Strengths: Average or above average intelligence  Capable of independent living  Communication skills  Physical Health  Supportive family/friends   Treatment Modalities: Medication Management, Group therapy, Case management,  1 to 1 session with clinician, Psychoeducation, Recreational therapy.   Physician Treatment Plan for Primary Diagnosis: MDD (major depressive disorder), recurrent episode, severe (HCC) Long Term Goal(s):     Short Term Goals:    Medication Management: Evaluate patient's response, side effects, and tolerance of medication regimen.  Therapeutic Interventions: 1 to 1 sessions, Unit Group sessions and Medication administration.  Evaluation of Outcomes: Not Progressing  Physician Treatment Plan for Secondary Diagnosis: Principal Problem:   MDD (major depressive disorder), recurrent episode, severe (HCC) Active Problems:   PTSD (post-traumatic stress disorder)   Mixed obsessional thoughts and acts  Long Term Goal(s):     Short Term Goals:       Medication Management: Evaluate patient's response, side effects, and tolerance of medication regimen.  Therapeutic Interventions: 1 to 1 sessions, Unit Group sessions and Medication administration.  Evaluation of Outcomes: Not Progressing   RN Treatment Plan for Primary Diagnosis: MDD (major depressive  disorder), recurrent episode, severe (HCC) Long Term Goal(s): Knowledge of disease and therapeutic regimen to maintain health will improve  Short Term Goals: Ability to remain free from injury will improve, Ability to verbalize frustration and anger appropriately will improve, Ability to demonstrate self-control, Ability to participate in decision making will improve, Ability to verbalize feelings will improve, Ability to disclose and discuss suicidal ideas, Ability to identify and develop effective coping behaviors will improve, and  Compliance with prescribed medications will improve  Medication Management: RN will administer medications as ordered by provider, will assess and evaluate patient's response and provide education to patient for prescribed medication. RN will report any adverse and/or side effects to prescribing provider.  Therapeutic Interventions: 1 on 1 counseling sessions, Psychoeducation, Medication administration, Evaluate responses to treatment, Monitor vital signs and CBGs as ordered, Perform/monitor CIWA, COWS, AIMS and Fall Risk screenings as ordered, Perform wound care treatments as ordered.  Evaluation of Outcomes: Not Progressing   LCSW Treatment Plan for Primary Diagnosis: MDD (major depressive disorder), recurrent episode, severe (HCC) Long Term Goal(s): Safe transition to appropriate next level of care at discharge, Engage patient in therapeutic group addressing interpersonal concerns.  Short Term Goals: Engage patient in aftercare planning with referrals and resources, Increase social support, Increase ability to appropriately verbalize feelings, Increase emotional regulation, Facilitate acceptance of mental health diagnosis and concerns, Facilitate patient progression through stages of change regarding substance use diagnoses and concerns, Identify triggers associated with mental health/substance abuse issues, and Increase skills for wellness and recovery  Therapeutic Interventions: Assess for all discharge needs, 1 to 1 time with Social worker, Explore available resources and support systems, Assess for adequacy in community support network, Educate family and significant other(s) on suicide prevention, Complete Psychosocial Assessment, Interpersonal group therapy.  Evaluation of Outcomes: Not Progressing   Progress in Treatment: Attending groups: No. Participating in groups: No. Taking medication as prescribed: Yes. Toleration medication: Yes. Family/Significant other contact made: No,  will contact:  consents pending. Patient understands diagnosis: Yes. Discussing patient identified problems/goals with staff: Yes. Medical problems stabilized or resolved: Yes. Denies suicidal/homicidal ideation: Yes. Issues/concerns per patient self-inventory: No. None reported.  New problem(s) identified: No, Describe:  None identified.  New Short Term/Long Term Goal(s): medication stabilization, elimination of SI thoughts, development of comprehensive mental wellness plan.    Patient Goals:  Getting better, get connected with a therapist and psychiatrist.  Discharge Plan or Barriers: Patient recently admitted. CSW will continue to follow and assess for appropriate referrals and possible discharge planning.    Reason for Continuation of Hospitalization: Depression Medication stabilization  Estimated Length of Stay: 3-5 days.  Last 3 [place] Suicide Severity Risk Score: Flowsheet Row [facility]Current[facility] Most recent reading at 10/19/2024 11:58 PM ED from 10/19/2024 in [facility] Most recent reading at 10/19/2024  8:41 PM ED from 06/18/2023 in [facility] Most recent reading at 06/18/2023  5:03 PM  C-SSRS RISK CATEGORY Moderate Risk Moderate Risk No Risk    Last PHQ 2/9 Scores:     No data to display          Scribe for Treatment Team: Marieliz Strang  Nunez-Uva, LCSWA 10/20/2024 2:52 PM

## 2024-10-20 NOTE — Plan of Care (Signed)
   Problem: Safety: Goal: Periods of time without injury will increase Outcome: Progressing   Problem: Medication: Goal: Compliance with prescribed medication regimen will improve Outcome: Progressing

## 2024-10-20 NOTE — Progress Notes (Signed)

## 2024-10-20 NOTE — Progress Notes (Signed)
(  Sleep Hours) - 5.25 hours (Any PRNs that were needed, meds refused, or side effects to meds)- None (Any disturbances and when (visitation, over night)- None (Concerns raised by the patient)-  None (SI/HI/AVH)-  Denies

## 2024-10-20 NOTE — Group Note (Signed)
 Recreation Therapy Group Note   Group Topic:Stress Management  Group Date: 10/20/2024 Start Time: 9056 End Time: 1058 Facilitators: Norvella Loscalzo-McCall, LRT,CTRS Location: 300 Hall Dayroom   Group Topic: Stress Management  Goal Area(s) Addresses:  Patient will identify positive stress management techniques. Patient will identify benefits of using stress management post d/c.  Behavioral Response:   Intervention: Let's Meditate App  Activity: LRT played a meditation called Turning the Page. The meditation encouraged patients to reflect on the past year, acknowledge their journey and everyday take each breath as a new step in towards their future goals.    Education:  Stress Management, Discharge Planning.   Education Outcome: Acknowledges Education   Affect/Mood: N/A   Participation Level: Did not attend    Clinical Observations/Individualized Feedback:      Plan: Continue to engage patient in RT group sessions 2-3x/week.   Joice Nazario-McCall, LRT,CTRS 10/20/2024 12:32 PM

## 2024-10-20 NOTE — Progress Notes (Signed)
(  Sleep Hours) - (Any PRNs that were needed, meds refused, or side effects to meds)- None (Any disturbances and when (visitation, over night)- None (Concerns raised by the patient)-  None (SI/HI/AVH)- Denies. Contracts for safety

## 2024-10-20 NOTE — Discharge Instructions (Signed)
 If interested in reading further about mind-body connection, consider reading: The Body Keeps the Score: Brain, Mind, and Body in the Healing of Trauma by Nathen salinas der Beverlee

## 2024-10-20 NOTE — Group Note (Unsigned)
 Date:  10/20/2024 Time:  8:32 PM  Group Topic/Focus:  Wrap-Up Group:   The focus of this group is to help patients review their daily goal of treatment and discuss progress on daily workbooks.     Participation Level:  {BHH PARTICIPATION OZCZO:77735}  Participation Quality:  {BHH PARTICIPATION QUALITY:22265}  Affect:  {BHH AFFECT:22266}  Cognitive:  {BHH COGNITIVE:22267}  Insight: {BHH Insight2:20797}  Engagement in Group:  {BHH ENGAGEMENT IN HMNLE:77731}  Modes of Intervention:  {BHH MODES OF INTERVENTION:22269}  Additional Comments:  ***  Gwenn Nobie [place] 10/20/2024, 8:32 PM

## 2024-10-20 NOTE — BHH Group Notes (Signed)
 Adult Psychoeducational Group Note  Date:  10/20/2024 Time:  9:37 PM  Group Topic/Focus:  Wrap-Up Group:   The focus of this group is to help patients review their daily goal of treatment and discuss progress on daily workbooks.  Participation Level:  Did Not Attend  Bonnie Turner 10/20/2024, 9:37 PM

## 2024-10-20 NOTE — BHH Group Notes (Signed)
 Adult Psychoeducational Group Note  Date:  10/20/2024 Time:  3:23 PM  Group Topic/Focus: Physical Wellness  Participation Level:  Active  Participation Quality:  Appropriate  Affect:  Appropriate  Cognitive:  Appropriate  Insight: Appropriate  Engagement in Group:  Engaged  Modes of Intervention:  Activity  Additional Comments:   Ronnell Puller 10/20/2024, 3:23 PM

## 2024-10-21 LAB — PROLACTIN: Prolactin: 23.3 ng/mL (ref 4.8–33.4)
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
## 2024-10-21 NOTE — Group Note (Signed)
 Date:  10/21/2024 Time:  11:13 PM  Group Topic/Focus:  Wrap-Up Group:   The focus of this group is to help patients review their daily goal of treatment and discuss progress on daily workbooks.    Participation Level:  Did Not Attend  Participation Quality:  N/A  Affect:  N/A  Cognitive:  N/A  Insight: None  Engagement in Group:  N/A  Modes of Intervention:  N/A  Additional Comments:  Patient did not attend  Bonnie Turner 10/21/2024, 11:13 PM

## 2024-10-21 NOTE — BHH Group Notes (Signed)
 Patient did not attend the [facility] group.

## 2024-10-21 NOTE — Plan of Care (Signed)
   Problem: Safety: Goal: Periods of time without injury will increase Outcome: Progressing

## 2024-10-21 NOTE — Group Note (Signed)
 Date:  10/21/2024 Time:  10:00 AM  Group Topic/Focus:  Goals Group:   The focus of this group is to help patients establish daily goals to achieve during treatment and discuss how the patient can incorporate goal setting into their daily lives to aide in recovery.    Participation Level:  Did Not Attend   Bonnie Turner 10/21/2024, 10:00 AM

## 2024-10-21 NOTE — Progress Notes (Signed)
" °   10/21/24 0800  Psych Admission Type (Psych Patients Only)  Admission Status Voluntary  Psychosocial Assessment  Patient Complaints Depression  Eye Contact Fair  Facial Expression Flat  Affect Depressed  Speech Logical/coherent  Interaction Minimal  Motor Activity Other (Comment) (wdl)  Appearance/Hygiene Unremarkable  Behavior Characteristics Cooperative;Appropriate to situation  Mood Depressed  Aggressive Behavior  Targets Other (Comment) (na)  Type of Behavior Other (Comment) (na)  Effect No apparent injury  Thought Process  Coherency WDL  Content WDL  Delusions WDL  Perception WDL  Hallucination None reported or observed  Judgment Impaired  Confusion None  Danger to Self  Current suicidal ideation? Denies  Agreement Not to Harm Self Yes  Description of Agreement verbal  Danger to Others  Danger to Others None reported or observed  Danger to Others Abnormal  Harmful Behavior to others No threats or harm toward other people  Destructive Behavior No threats or harm toward property    "

## 2024-10-21 NOTE — Group Note (Signed)
 LCSW Group Therapy Note   Group Date: 10/21/2024 Start Time: 1100 End Time: 1200   Participation:  did not attend  Type of Therapy:  Group Therapy   Topic:  Understanding Your Path to Change.  Objective:  The goal is to help individuals understand the stages of change, identify where they currently are in the process, and provide actionable next steps to continue moving forward in their journey of change.  Goals: - Learn about the six stages of change:  Precontemplation, Contemplation, Preparation, Action, Maintenance, and Relapse - Reflect on Current Change Efforts:  Recognize which stage participants are in regarding a personal change. - Plan Next Steps for Moving Forward:  Create an action plan based on their current stage of change.  Class Summary:  In this session, we explored the Stages of Change as a framework to understand the process of change.  We discussed how each stage helps individuals recognize where they are in their personal journey and used the Stages of Change Worksheet for self-reflection. Participants answered questions to better understand their current stage, challenges, and progress. We also emphasized the importance of moving forward, even if setbacks (Relapse) occur, and created actionable steps to help participants continue progressing. By the end of the session, participants gained a clearer understanding of their path to change and left with a clear plan for next steps.  Therapeutic Modalities: Elements of CBT (cognitive restructuring, problem solving)  Element of DBT (mindfulness, distress tolerance)   Asees Manfredi O Shykeem Resurreccion, LCSWA 10/21/2024  12:55 PM

## 2024-10-21 NOTE — Plan of Care (Signed)
   Problem: Activity: Goal: Interest or engagement in activities will improve Outcome: Progressing   Problem: Coping: Goal: Ability to verbalize frustrations and anger appropriately will improve Outcome: Progressing   Problem: Safety: Goal: Periods of time without injury will increase Outcome: Progressing

## 2024-10-21 NOTE — Progress Notes (Signed)
(  Sleep Hours) - (Any PRNs that were needed, meds refused, or side effects to meds)- None (Any disturbances and when (visitation, over night)- None (Concerns raised by the patient)- None (SI/HI/AVH)- Denies. Contracts for safety

## 2024-10-21 NOTE — [facility] Group Notes (Signed)
 Patient did not attend the Social Work group.

## 2024-10-21 NOTE — Group Note (Signed)
 Recreation Therapy Group Note   Group Topic:Animal Assisted Therapy   Group Date: 10/21/2024 Start Time: 9050 End Time: 1030 Facilitators: Tab Rylee-McCall, LRT,CTRS Location: [address] Dayroom   Animal-Assisted Activity (AAA) Program Checklist/Progress Notes Patient Eligibility Criteria Checklist & Daily Group note for Rec Tx Intervention  AAA/T Program Assumption of Risk Form signed by Patient/ or Parent Legal Guardian Yes  Patient understands his/her participation is voluntary Yes  Behavioral Response:    Education: Charity Fundraiser, Appropriate Animal Interaction   Education Outcome: Acknowledges education.    Affect/Mood: N/A   Participation Level: Did not attend    Clinical Observations/Individualized Feedback:      Plan: Continue to engage patient in RT group sessions 2-3x/week.   Angelmarie Ponzo-McCall, LRT,CTRS 10/21/2024 11:58 AM

## 2024-10-21 NOTE — Group Note (Signed)
 Date:  10/21/2024 Time:  5:00 PM  Group Topic/Focus:  Dimensions of Wellness:   The focus of this group is to introduce the topic of wellness and discuss the role each dimension of wellness plays in total health.    Participation Level:  Did Not Attend    Annalee Larch 10/21/2024, 5:00 PM

## 2024-10-21 NOTE — Progress Notes (Signed)
 [facility] MD Progress Note  10/21/2024 10:43 AM Bonnie Turner  MRN:  [id number] Subjective:   Bonnie Turner is a 39 yr old female who presented on 1/11 to [facility] with SI and HI after breaking up/fighting with her boyfriend due to him cheating, she was admitted to [facility] on 1/12.  PPHx is significant for OCD, Depression, PTSD, and 1 Prior Psychiatric Hospitalization (06/2019), and no Prior Suicide Attempts or Self Injurious Behavior.   Case was discussed in the multidisciplinary team. MAR was reviewed and patient was compliant with medications.  She did not receive any PRN medications yesterday.   Psychiatric Team made the following recommendations yesterday: -Increase fluvoxamine  to 75 mg daily tomorrow to address depression, PTSD and OCD -Continue doxepin  50 mg at bedtime for sleep -discontinue lamotrigine  after dosage today   On interview today patient reports she slept good last night.  She reports her appetite is doing good.  She reports no SI, HI, or AVH.  She reports no Paranoia or Ideas of Reference.  She reports no issues with her medications.  Discussed with her that we would continue with the planned increase in her Luvox  today and she was agreeable with this.  She reports that she has started to feel better as she has done some thinking about things.  Encouraged her to continue Processing her thoughts and feelings and encouraged her to attend groups.  She reports no other concerns at present.    Principal Problem: MDD (major depressive disorder), recurrent episode, severe (HCC) Diagnosis: Principal Problem:   MDD (major depressive disorder), recurrent episode, severe (HCC) Active Problems:   PTSD (post-traumatic stress disorder)   Mixed obsessional thoughts and acts  Total Time spent with patient:  I personally spent 35 minutes on the unit in direct patient care. The direct patient care time included face-to-face time with the patient, reviewing the patient's chart, communicating  with other professionals, and coordinating care.    Past Psychiatric History:  OCD, Depression, PTSD, and 1 Prior Psychiatric Hospitalization (06/2019), and no Prior Suicide Attempts or Self Injurious Behavior.  Past Medical History:  Past Medical History:  Diagnosis Date   Anxiety    BV (bacterial vaginosis)    Panic disorder    History reviewed. No pertinent surgical history. Family History: History reviewed. No pertinent family history. Family Psychiatric  History:  Reports None  Social History:  Social History   Substance and Sexual Activity  Alcohol Use Yes     Social History   Substance and Sexual Activity  Drug Use Yes   Types: Marijuana    Social History   Socioeconomic History   Marital status: Single    Spouse name: Not on file   Number of children: Not on file   Years of education: Not on file   Highest education level: Not on file  Occupational History   Not on file  Tobacco Use   Smoking status: Some Days    Types: Cigarettes   Smokeless tobacco: Never  Vaping Use   Vaping status: Never Used  Substance and Sexual Activity   Alcohol use: Yes   Drug use: Yes    Types: Marijuana   Sexual activity: Not on file  Other Topics Concern   Not on file  Social History Narrative   Not on file   Social Drivers of Health   Tobacco Use: High Risk (10/20/2024)   Patient History    Smoking Tobacco Use: Some Days    Smokeless Tobacco Use: Never  Passive Exposure: Not on file  Financial Resource Strain: Low Risk (05/13/2024)   Received from [facility]   Overall Financial Resource Strain (CARDIA)    How hard is it for you to pay for the very basics like food, housing, medical care, and heating?: Not hard at all  Food Insecurity: No Food Insecurity (10/20/2024)   Epic    Worried About Programme Researcher, Broadcasting/film/video in the Last Year: Never true    Ran Out of Food in the Last Year: Never true  Transportation Needs: No Transportation Needs (10/20/2024)   Epic    Lack  of Transportation (Medical): No    Lack of Transportation (Non-Medical): No  Physical Activity: Insufficiently Active (05/13/2024)   Received [facility]   Exercise Vital Sign    On average, how many days per week do you engage in moderate to strenuous exercise (like a brisk walk)?: 3 days    On average, how many minutes do you engage in exercise at this level?: 40 min  Stress: No Stress Concern Present (05/13/2024)   Received from [facility] - Occupational Stress Questionnaire    Do you feel stress - tense, restless, nervous, or anxious, or unable to sleep at night because your mind is troubled all the time - these days?: Only a little  Social Connections: Socially Integrated (05/13/2024)   Received from [facility]   Social Network    How would you rate your social network (family, work, friends)?: Good participation with social networks  Depression (PHQ2-9): Not on file  Alcohol Screen: Low Risk (10/20/2024)   Alcohol Screen    Last Alcohol Screening Score (AUDIT): 0  Housing: Low Risk (10/20/2024)   Epic    Unable to Pay for Housing in the Last Year: No    Number of Times Moved in the Last Year: 1    Homeless in the Last Year: No  Utilities: Not At Risk (10/20/2024)   Epic    Threatened with loss of utilities: No  Health Literacy: Not on file   Additional Social History:                         Sleep: Good Estimated Sleeping Duration (Last 24 Hours): 9.75-11.00 hours  Appetite:  Good  Current Medications: Current Facility-Administered Medications  Medication Dose Route Frequency Provider Last Rate Last Admin   acetaminophen  (TYLENOL ) tablet 650 mg  650 mg Oral Q6H PRN Gottfried, Rhoda J, MD       alum & mag hydroxide-simeth (MAALOX/MYLANTA) 200-200-20 MG/5ML suspension 30 mL  30 mL Oral Q4H PRN Gottfried, Rhoda J, MD       haloperidol  (HALDOL ) tablet 5 mg  5 mg Oral TID PRN Gottfried, Rhoda J, MD       And    diphenhydrAMINE  (BENADRYL ) capsule 50 mg  50 mg Oral TID PRN Gottfried, Rhoda J, MD       haloperidol  lactate (HALDOL ) injection 5 mg  5 mg Intramuscular TID PRN Gottfried, Rhoda J, MD       And   diphenhydrAMINE  (BENADRYL ) injection 50 mg  50 mg Intramuscular TID PRN Gottfried, Rhoda J, MD       And   LORazepam  (ATIVAN ) injection 2 mg  2 mg Intramuscular TID PRN Gottfried, Rhoda J, MD       haloperidol  lactate (HALDOL ) injection 10 mg  10 mg Intramuscular TID PRN Lawrnce Garvin PARAS, MD  And   diphenhydrAMINE  (BENADRYL ) injection 50 mg  50 mg Intramuscular TID PRN Lawrnce Garvin PARAS, MD       And   LORazepam  (ATIVAN ) injection 2 mg  2 mg Intramuscular TID PRN Lawrnce Garvin PARAS, MD       doxepin  (SINEQUAN ) capsule 50 mg  50 mg Oral QHS Gottfried, Rhoda J, MD   50 mg at 10/20/24 2211   feeding supplement (ENSURE PLUS HIGH PROTEIN) liquid 237 mL  237 mL Oral BID BM Towana Leita SAILOR, MD       fluvoxaMINE  (LUVOX ) tablet 75 mg  75 mg Oral Daily Towana Leita SAILOR, MD   75 mg at 10/21/24 9189   magnesium  hydroxide (MILK OF MAGNESIA) suspension 30 mL  30 mL Oral Daily PRN Lawrnce Garvin PARAS, MD        Lab Results:  Results for orders placed or performed during the hospital encounter of 10/19/24 (from the past 48 hours)  POCT Urine Drug Screen - (I-Screen)     Status: Normal   Collection Time: 10/19/24  7:52 PM  Result Value Ref Range   POC Amphetamine UR None Detected NONE DETECTED (Cut Off Level 1000 ng/mL)   POC Secobarbital (BAR) None Detected NONE DETECTED (Cut Off Level 300 ng/mL)   POC Buprenorphine (BUP) None Detected NONE DETECTED (Cut Off Level 10 ng/mL)   POC Oxazepam (BZO) None Detected NONE DETECTED (Cut Off Level 300 ng/mL)   POC Cocaine UR None Detected NONE DETECTED (Cut Off Level 300 ng/mL)   POC Methamphetamine UR None Detected NONE DETECTED (Cut Off Level 1000 ng/mL)   POC Morphine None Detected NONE DETECTED (Cut Off Level 300 ng/mL)   POC Methadone UR None Detected NONE  DETECTED (Cut Off Level 300 ng/mL)   POC Oxycodone UR None Detected NONE DETECTED (Cut Off Level 100 ng/mL)   POC Marijuana UR None Detected NONE DETECTED (Cut Off Level 50 ng/mL)  POC urine preg, ED     Status: Normal   Collection Time: 10/19/24  7:53 PM  Result Value Ref Range   Preg Test, Ur Negative Negative  GC/Chlamydia probe amp ()not at [facility]     Status: None   Collection Time: 10/19/24  7:54 PM  Result Value Ref Range   Neisseria Gonorrhea Negative    Chlamydia Negative    Comment Normal Reference Ranger Chlamydia - Negative    Comment      Normal Reference Range Neisseria Gonorrhea - Negative  Urinalysis, Routine w reflex microscopic -Urine, Clean Catch     Status: Abnormal   Collection Time: 10/19/24  7:54 PM  Result Value Ref Range   Color, Urine YELLOW YELLOW   APPearance CLOUDY (A) CLEAR   Specific Gravity, Urine 1.026 1.005 - 1.030   pH 7.0 5.0 - 8.0   Glucose, UA NEGATIVE NEGATIVE mg/dL   Hgb urine dipstick NEGATIVE NEGATIVE   Bilirubin Urine NEGATIVE NEGATIVE   Ketones, ur NEGATIVE NEGATIVE mg/dL   Protein, ur NEGATIVE NEGATIVE mg/dL   Nitrite NEGATIVE NEGATIVE   Leukocytes,Ua NEGATIVE NEGATIVE    Comment: Performed at [facility], [address], [place], [place] [address]  Vitamin B12     Status: None   Collection Time: 10/19/24  8:14 PM  Result Value Ref Range   Vitamin B-12 651 180 - 914 pg/mL    Comment: Performed at [facility], [address], [place], [place] [address]  CBC with Differential/Platelet     Status: Abnormal  Collection Time: 10/19/24  8:22 PM  Result Value Ref Range   WBC 6.8 4.0 - 10.5 K/uL   RBC 4.81 3.87 - 5.11 MIL/uL   Hemoglobin 15.3 (H) 12.0 - 15.0 g/dL   HCT 55.5 63.9 - 53.9 %   MCV 92.3 80.0 - 100.0 fL   MCH 31.8 26.0 - 34.0 pg   MCHC 34.5 30.0 - 36.0 g/dL   RDW 87.9 88.4 - 84.4 %   Platelets 389 150 - 400 K/uL   nRBC 0.0 0.0 - 0.2 %   Neutrophils Relative % 64 %   Neutro Abs 4.3 1.7 - 7.7 K/uL    Lymphocytes Relative 28 %   Lymphs Abs 1.9 0.7 - 4.0 K/uL   Monocytes Relative 6 %   Monocytes Absolute 0.4 0.1 - 1.0 K/uL   Eosinophils Relative 1 %   Eosinophils Absolute 0.1 0.0 - 0.5 K/uL   Basophils Relative 1 %   Basophils Absolute 0.0 0.0 - 0.1 K/uL   Immature Granulocytes 0 %   Abs Immature Granulocytes 0.01 0.00 - 0.07 K/uL    Comment: Performed at [facility], [address] [place], [place] [address]  Comprehensive metabolic panel     Status: Abnormal   Collection Time: 10/19/24  8:22 PM  Result Value Ref Range   Sodium 137 135 - 145 mmol/L   Potassium 4.2 3.5 - 5.1 mmol/L   Chloride 100 98 - 111 mmol/L   CO2 24 22 - 32 mmol/L   Glucose, Bld 67 (L) 70 - 99 mg/dL    Comment: Glucose reference range applies only to samples taken after fasting for at least 8 hours.   BUN 8 6 - 20 mg/dL   Creatinine, Ser 8.93 (H) 0.44 - 1.00 mg/dL   Calcium 9.8 8.9 - 89.6 mg/dL   Total Protein 9.1 (H) 6.5 - 8.1 g/dL   Albumin 5.1 (H) 3.5 - 5.0 g/dL   AST 23 15 - 41 U/L   ALT 40 0 - 44 U/L   Alkaline Phosphatase 84 38 - 126 U/L   Total Bilirubin 0.3 0.0 - 1.2 mg/dL   GFR, Estimated >39 >39 mL/min    Comment: (NOTE) Calculated using the CKD-EPI Creatinine Equation (2021)    Anion gap 13 5 - 15    Comment: Performed at [facility], [address] [place], [place] [address]  TSH     Status: None   Collection Time: 10/19/24  8:22 PM  Result Value Ref Range   TSH 1.460 0.350 - 4.500 uIU/mL    Comment: Performed at [facility], [address], [place], [place] [address]  RPR     Status: None   Collection Time: 10/19/24  8:22 PM  Result Value Ref Range   RPR Ser Ql NON REACTIVE NON REACTIVE    Comment: Performed at [facility], [address], [place], [place] [address]  Hepatitis panel, acute     Status: None   Collection Time: 10/19/24  8:22 PM  Result Value Ref Range   Hepatitis B Surface Ag NON REACTIVE NON REACTIVE   HCV Ab NON REACTIVE NON REACTIVE     Comment: (NOTE) Nonreactive HCV antibody screen is consistent with no HCV infections,  unless recent infection is suspected or other evidence exists to indicate HCV infection.     Hep A IgM NON REACTIVE NON REACTIVE   Hep B C IgM NON REACTIVE NON REACTIVE    Comment: Performed  at [facility], [address], [place], [place] [address]  Prolactin     Status: None   Collection Time: 10/19/24  8:22 PM  Result Value Ref Range   Prolactin 23.3 4.8 - 33.4 ng/mL    Comment: (NOTE) Performed At: [facility] [address] [place], [place] [id number] Jennette Shorter MD Ey:[id number]   HIV Antibody (routine testing w rflx)     Status: None   Collection Time: 10/19/24  8:22 PM  Result Value Ref Range   HIV Screen 4th Generation wRfx Non Reactive Non Reactive    Comment: Performed at [facility], [address], [place], [place] [address]  VITAMIN D  25 Hydroxy (Vit-D Deficiency, Fractures)     Status: None   Collection Time: 10/19/24  8:22 PM  Result Value Ref Range   Vit D, 25-Hydroxy 36.7 30 - 100 ng/mL    Comment: (NOTE) Vitamin D  deficiency has been defined by the Institute of Medicine  and an Endocrine Society practice guideline as a level of serum 25-OH  vitamin D  less than 20 ng/mL (1,2). The Endocrine Society went on to  further define vitamin D  insufficiency as a level between 21 and 29  ng/mL (2).  1. IOM (Institute of Medicine). 2010. Dietary reference intakes for  calcium and D. [place]: [employer]. 2. Holick MF, Binkley [place], Bischoff-Ferrari HA, et al. Evaluation,  treatment, and prevention of vitamin D  deficiency: an Endocrine  Society clinical practice guideline, JCEM. 2011 Jul; 96(7): 1911-30.  Performed at [facility], [address], [place], [place] [address]   Ethanol     Status: None   Collection Time: 10/19/24  8:30 PM  Result Value Ref Range   Alcohol, Ethyl (B) <15 <15 mg/dL    Comment: (NOTE) For  medical purposes only. Performed at [facility], [address], [place], [place] [address]     Blood Alcohol level:  Lab Results  Component Value Date   [facility] <15 10/19/2024   ETH <10 06/18/2023    Metabolic Disorder Labs: No results found for: HGBA1C, MPG Lab Results  Component Value Date   PROLACTIN 23.3 10/19/2024   No results found for: CHOL, TRIG, HDL, CHOLHDL, VLDL, LDLCALC  Physical Findings: AIMS:  ,  ,  ,  ,  ,  ,   CIWA:    COWS:     Musculoskeletal: Strength & Muscle Tone: within normal limits Gait & Station: normal Patient leans: N/A  Psychiatric Specialty Exam:  Presentation  General Appearance:  Appropriate for Environment; Casual  Eye Contact: Fair  Speech: Clear and Coherent; Normal Rate  Speech Volume: Normal  Handedness: Right   Mood and Affect  Mood: Dysphoric  Affect: Congruent   Thought Process  Thought Processes: Coherent; Goal Directed  Descriptions of Associations:Intact  Orientation:Full (Time, Place and Person)  Thought Content:Logical; WDL  History of Schizophrenia/Schizoaffective disorder:No  Duration of Psychotic Symptoms:No data recorded Hallucinations:Hallucinations: None  Ideas of Reference:None  Suicidal Thoughts:Suicidal Thoughts: No  Homicidal Thoughts:Homicidal Thoughts: No   Sensorium  Memory: Immediate Fair; Recent Fair  Judgment: Fair  Insight: Fair   Art Therapist  Concentration: Good  Attention Span: Good  Recall: Good  Fund of Knowledge: Good  Language: Good   Psychomotor Activity  Psychomotor Activity:Psychomotor Activity: Normal   Assets  Assets: Communication Skills; Desire for Improvement; Physical Health; Resilience; Social Support   Sleep  Sleep:Sleep: Good    Physical Exam: Physical Exam  Vitals and nursing note reviewed.  Constitutional:      General: She is not in acute distress.    Appearance: Normal appearance.  She is normal weight. She is not ill-appearing or toxic-appearing.  HENT:     Head: Normocephalic and atraumatic.  Pulmonary:     Effort: Pulmonary effort is normal.  Musculoskeletal:        General: Normal range of motion.  Neurological:     General: No focal deficit present.     Mental Status: She is alert.    Review of Systems  Respiratory:  Negative for cough and shortness of breath.   Cardiovascular:  Negative for chest pain.  Gastrointestinal:  Negative for abdominal pain, constipation, diarrhea, nausea and vomiting.  Neurological:  Negative for dizziness, weakness and headaches.  Psychiatric/Behavioral:  Negative for depression, hallucinations and suicidal ideas. The patient is not nervous/anxious.    Blood pressure 101/71, pulse 82, temperature 97.7 F (36.5 C), temperature source Oral, resp. rate 16, height 5' 3 (1.6 m), weight 79.4 kg, last menstrual period 08/06/2024, SpO2 100%. Body mass index is 31 kg/m.   Treatment Plan Summary: Daily contact with patient to assess and evaluate symptoms and progress in treatment and Medication management  Bonnie Turner is a 39 yr old female who presented on 1/11 to [facility] with SI and HI after breaking up/fighting with her boyfriend due to him cheating, she was admitted to [facility] on 1/12.  PPHx is significant for OCD, Depression, PTSD, and 1 Prior Psychiatric Hospitalization (06/2019), and no Prior Suicide Attempts or Self Injurious Behavior.   Andreyah is reporting improvement with being able to process.  We did proceed with the planned increase in Luvox .  We will not make any other changes to her medications at this time.  We will continue to monitor.    MDD, Recurrent, Severe, w/out Psychosis  PTSD  Mixed Obsessional Thought and Acts: -Increase Luvox  to 75 mg daily for depression and obsessive thoughts -Continue Doxepin  50 mg QHS for depression and obsessive thoughts -Continue Agitation Protocol:  Haldol /Ativan /Benadryl    -Continue PRN's: Tylenol , Maalox, Atarax , Milk of Magnesia   --  The risks/benefits/side-effects/alternatives to medications were discussed in detail with the patient and time was given for questions. The patient consents to medication trials.                            -- Encouraged patient to participate in unit milieu and in scheduled group therapies              -- Short Term Goals: Ability to identify changes in lifestyle to reduce recurrence of condition will improve, Ability to verbalize feelings will improve, Ability to disclose and discuss suicidal ideas, Ability to demonstrate self-control will improve, Ability to identify and develop effective coping behaviors will improve, Ability to maintain clinical measurements within normal limits will improve, Compliance with prescribed medications will improve, and Ability to identify triggers associated with substance abuse/mental health issues will improve             -- Long Term Goals: Improvement in symptoms so as ready for discharge   Safety and Monitoring:             -- Voluntary admission to inpatient psychiatric unit for safety, stabilization and treatment             -- Daily contact with patient to assess and evaluate symptoms and progress in treatment             --  Patient's case to be discussed in multi-disciplinary team meeting             -- Observation Level : q15 minute checks             -- Vital signs:  q12 hours             -- Precautions: suicide, elopement, and assault  Discharge Planning:              -- Social work and case management to assist with discharge planning and identification of hospital follow-up needs prior to discharge             -- Estimated LOS: 2-3 more days             -- Discharge Concerns: Need to establish a safety plan; Medication compliance and effectiveness             -- Discharge Goals: Return home with outpatient referrals for mental health follow-up including  medication management/psychotherapy    Marsa GORMAN Rosser, DO 10/21/2024, 10:43 AM

## 2024-10-21 NOTE — [facility] Suicide Risk Assessment (Signed)
 [facility] INPATIENT:  Family/Significant Other Suicide Prevention Education  Suicide Prevention Education:  Patient Refusal for Family/Significant Other Suicide Prevention Education: The patient Bonnie Turner has refused to provide written consent for family/significant other to be provided Family/Significant Other Suicide Prevention Education during admission and/or prior to discharge.  Physician notified.  Roselyn GORMAN Lento 10/21/2024, 9:57 AM

## 2024-10-22 LAB — POCT PREGNANCY, URINE: Preg Test, Ur: NEGATIVE

## 2024-10-22 NOTE — Group Note (Signed)
 Date:  10/22/2024 Time:  1:25 PM  Group Topic/Focus:  Spirituality:   The focus of this group is to discuss how one's spirituality can aide in recovery.    Participation Level:  Did not attend   Trask Vosler 10/22/2024, 1:25 PM

## 2024-10-22 NOTE — Group Note (Signed)
 Date:  10/22/2024 Time:  10:01 AM  Group Topic/Focus:  Recreational Therapy    Participation Level:  Did Not Attend    Bonnie Turner 10/22/2024, 10:01 AM

## 2024-10-22 NOTE — Plan of Care (Signed)
   Problem: Education: Goal: Knowledge of [place] [facility] Education information/materials will improve Outcome: Progressing Goal: Emotional status will improve Outcome: Progressing Goal: Mental status will improve Outcome: Progressing Goal: Verbalization of understanding the information provided will improve Outcome: Progressing

## 2024-10-22 NOTE — Plan of Care (Signed)
  Problem: Education: Goal: Mental status will improve Outcome: Progressing   Problem: Activity: Goal: Interest or engagement in activities will improve Outcome: Progressing   Problem: Coping: Goal: Ability to verbalize frustrations and anger appropriately will improve Outcome: Progressing

## 2024-10-22 NOTE — Group Note (Signed)
 Date:  10/22/2024 Time:  5:00 PM  Group Topic/Focus:  Managing Feelings:   The focus of this group is to identify what feelings patients have difficulty handling and develop a plan to handle them in a healthier way upon discharge. Overcoming Stress:   The focus of this group is to define stress and help patients assess their triggers.    Participation Level:  Did Not Attend  Participation Quality:    Affect:    Cognitive:   Insight:   Engagement in Group:    Modes of Intervention:    Additional Comments:    Bonnie Turner Terek Bee 10/22/2024, 5:00 PM

## 2024-10-22 NOTE — [facility] Group Notes (Signed)
 [facility] Group Notes:  (Nursing/MHT/Case Management/Adjunct)  Date:  10/22/2024  Time:  9:34 PM  Type of Therapy:  NA group  Participation Level:  Didn't attend  Participation Quality:    Affect:    Cognitive:    Insight:    Engagement in Group:    Modes of Intervention:    Summary of Progress/Problems:  Grayce LITTIE Essex 10/22/2024, 9:34 PM

## 2024-10-22 NOTE — Group Note (Deleted)
 Date:  10/22/2024 Time:  8:43 AM  Group Topic/Focus:  Goals Group:   The focus of this group is to help patients establish daily goals to achieve during treatment and discuss how the patient can incorporate goal setting into their daily lives to aide in recovery. Orientation:   The focus of this group is to educate the patient on the purpose and policies of crisis stabilization and provide a format to answer questions about their admission.  The group details unit policies and expectations of patients while admitted.    Participation Level:  {BHH PARTICIPATION OZCZO:77735}  Participation Quality:  {BHH PARTICIPATION QUALITY:22265}  Affect:  {BHH AFFECT:22266}  Cognitive:  {BHH COGNITIVE:22267}  Insight: {BHH Insight2:20797}  Engagement in Group:  {BHH ENGAGEMENT IN HMNLE:77731}  Modes of Intervention:  {BHH MODES OF INTERVENTION:22269}  Additional Comments:  ***  Bonnie Turner 10/22/2024, 8:43 AM

## 2024-10-22 NOTE — Progress Notes (Signed)
 [facility] MD Progress Note  10/22/2024 9:29 AM Bonnie Turner  MRN:  [id number] Subjective:   Bonnie Turner is a 39 yr old female who presented on 1/11 to [facility] with SI and HI after breaking up/fighting with her boyfriend due to him cheating, she was admitted to [facility] on 1/12.  PPHx is significant for OCD, Depression, PTSD, and 1 Prior Psychiatric Hospitalization (06/2019), and no Prior Suicide Attempts or Self Injurious Behavior.   Case was discussed in the multidisciplinary team. MAR was reviewed and patient was compliant with medications.  She did not require any PRN Medications yesterday.   Psychiatric Team made the following recommendations yesterday: -Increase Luvox  to 75 mg daily for depression and obsessive thoughts -Continue Doxepin  50 mg QHS for depression and obsessive thoughts   On interview today patient reports she slept good last night.  She reports her appetite is doing good.  She reports no SI, HI, or AVH.  She reports no Paranoia or Ideas of Reference.  She reports no issues with her medications.  She reports that she has done a lot of processing.  She reports that she was out in groups yesterday and has made connections with a lot of patients.  She reports that she had good conversation with her roommate which was helpful.  Discussed with her that if she continues doing well we will plan for discharge tomorrow and she was agreeable with that.  She reports no other concerns at present.    Principal Problem: MDD (major depressive disorder), recurrent episode, severe (HCC) Diagnosis: Principal Problem:   MDD (major depressive disorder), recurrent episode, severe (HCC) Active Problems:   PTSD (post-traumatic stress disorder)   Mixed obsessional thoughts and acts  Total Time spent with patient:  I personally spent 35 minutes on the unit in direct patient care. The direct patient care time included face-to-face time with the patient, reviewing the patient's chart, communicating  with other professionals, and coordinating care.    Past Psychiatric History:  OCD, Depression, PTSD, and 1 Prior Psychiatric Hospitalization (06/2019), and no Prior Suicide Attempts or Self Injurious Behavior.  Past Medical History:  Past Medical History:  Diagnosis Date   Anxiety    BV (bacterial vaginosis)    Panic disorder    History reviewed. No pertinent surgical history. Family History: History reviewed. No pertinent family history. Family Psychiatric  History:  Reports None  Social History:  Social History   Substance and Sexual Activity  Alcohol Use Yes     Social History   Substance and Sexual Activity  Drug Use Yes   Types: Marijuana    Social History   Socioeconomic History   Marital status: Single    Spouse name: Not on file   Number of children: Not on file   Years of education: Not on file   Highest education level: Not on file  Occupational History   Not on file  Tobacco Use   Smoking status: Some Days    Types: Cigarettes   Smokeless tobacco: Never  Vaping Use   Vaping status: Never Used  Substance and Sexual Activity   Alcohol use: Yes   Drug use: Yes    Types: Marijuana   Sexual activity: Not on file  Other Topics Concern   Not on file  Social History Narrative   Not on file   Social Drivers of Health   Tobacco Use: High Risk (10/20/2024)   Patient History    Smoking Tobacco Use: Some Days  Smokeless Tobacco Use: Never    Passive Exposure: Not on file  Financial Resource Strain: Low Risk (05/13/2024)   Received from [facility]   Overall Financial Resource Strain (CARDIA)    How hard is it for you to pay for the very basics like food, housing, medical care, and heating?: Not hard at all  Food Insecurity: No Food Insecurity (10/20/2024)   Epic    Worried About Programme Researcher, Broadcasting/film/video in the Last Year: Never true    Ran Out of Food in the Last Year: Never true  Transportation Needs: No Transportation Needs (10/20/2024)   Epic    Lack  of Transportation (Medical): No    Lack of Transportation (Non-Medical): No  Physical Activity: Insufficiently Active (05/13/2024)   Received from [facility]   Exercise Vital Sign    On average, how many days per week do you engage in moderate to strenuous exercise (like a brisk walk)?: 3 days    On average, how many minutes do you engage in exercise at this level?: 40 min  Stress: No Stress Concern Present (05/13/2024)   Received from [facility] - Occupational Stress Questionnaire    Do you feel stress - tense, restless, nervous, or anxious, or unable to sleep at night because your mind is troubled all the time - these days?: Only a little  Social Connections: Socially Integrated (05/13/2024)   Received from [facility]   Social Network    How would you rate your social network (family, work, friends)?: Good participation with social networks  Depression (PHQ2-9): Not on file  Alcohol Screen: Low Risk (10/20/2024)   Alcohol Screen    Last Alcohol Screening Score (AUDIT): 0  Housing: Low Risk (10/20/2024)   Epic    Unable to Pay for Housing in the Last Year: No    Number of Times Moved in the Last Year: 1    Homeless in the Last Year: No  Utilities: Not At Risk (10/20/2024)   Epic    Threatened with loss of utilities: No  Health Literacy: Not on file   Additional Social History:                         Sleep: Good Estimated Sleeping Duration (Last 24 Hours): 7.75-8.25 hours  Appetite:  Good  Current Medications: Current Facility-Administered Medications  Medication Dose Route Frequency Provider Last Rate Last Admin   acetaminophen  (TYLENOL ) tablet 650 mg  650 mg Oral Q6H PRN Gottfried, Rhoda J, MD       alum & mag hydroxide-simeth (MAALOX/MYLANTA) 200-200-20 MG/5ML suspension 30 mL  30 mL Oral Q4H PRN Gottfried, Rhoda J, MD       haloperidol  (HALDOL ) tablet 5 mg  5 mg Oral TID PRN Gottfried, Rhoda J, MD       And    diphenhydrAMINE  (BENADRYL ) capsule 50 mg  50 mg Oral TID PRN Gottfried, Rhoda J, MD       haloperidol  lactate (HALDOL ) injection 5 mg  5 mg Intramuscular TID PRN Gottfried, Rhoda J, MD       And   diphenhydrAMINE  (BENADRYL ) injection 50 mg  50 mg Intramuscular TID PRN Gottfried, Rhoda J, MD       And   LORazepam  (ATIVAN ) injection 2 mg  2 mg Intramuscular TID PRN Gottfried, Rhoda J, MD       haloperidol  lactate (HALDOL ) injection 10 mg  10 mg Intramuscular TID PRN  Lawrnce Garvin PARAS, MD       And   diphenhydrAMINE  (BENADRYL ) injection 50 mg  50 mg Intramuscular TID PRN Gottfried, Rhoda J, MD       And   LORazepam  (ATIVAN ) injection 2 mg  2 mg Intramuscular TID PRN Gottfried, Rhoda J, MD       doxepin  (SINEQUAN ) capsule 50 mg  50 mg Oral QHS Gottfried, Rhoda J, MD   50 mg at 10/21/24 2123   feeding supplement (ENSURE PLUS HIGH PROTEIN) liquid 237 mL  237 mL Oral BID BM Towana Leita SAILOR, MD       fluvoxaMINE  (LUVOX ) tablet 75 mg  75 mg Oral Daily Towana Leita SAILOR, MD   75 mg at 10/22/24 9189   magnesium  hydroxide (MILK OF MAGNESIA) suspension 30 mL  30 mL Oral Daily PRN Gottfried, Rhoda J, MD        Lab Results:  No results found for this or any previous visit (from the past 48 hours).   Blood Alcohol level:  Lab Results  Component Value Date   [facility] <15 10/19/2024   ETH <10 06/18/2023    Metabolic Disorder Labs: No results found for: HGBA1C, MPG Lab Results  Component Value Date   PROLACTIN 23.3 10/19/2024   No results found for: CHOL, TRIG, HDL, CHOLHDL, VLDL, LDLCALC  Physical Findings: AIMS:  ,  ,  ,  ,  ,  ,   CIWA:    COWS:     Musculoskeletal: Strength & Muscle Tone: within normal limits Gait & Station: normal Patient leans: N/A  Psychiatric Specialty Exam:  Presentation  General Appearance:  Appropriate for Environment; Casual  Eye Contact: Fair  Speech: Clear and Coherent; Normal Rate  Speech Volume: Normal  Handedness: Right   Mood  and Affect  Mood: -- (better)  Affect: Appropriate; Congruent   Thought Process  Thought Processes: Coherent; Goal Directed  Descriptions of Associations:Intact  Orientation:Full (Time, Place and Person)  Thought Content:Logical; WDL  History of Schizophrenia/Schizoaffective disorder:No  Duration of Psychotic Symptoms:No data recorded Hallucinations:Hallucinations: None  Ideas of Reference:None  Suicidal Thoughts:Suicidal Thoughts: No  Homicidal Thoughts:Homicidal Thoughts: No   Sensorium  Memory: Immediate Fair; Recent Fair  Judgment: Fair  Insight: Fair   Art Therapist  Concentration: Good  Attention Span: Good  Recall: Good  Fund of Knowledge: Good  Language: Good   Psychomotor Activity  Psychomotor Activity:Psychomotor Activity: Normal   Assets  Assets: Communication Skills; Desire for Improvement; Resilience; Physical Health; Social Support   Sleep  Sleep:Sleep: Good    Physical Exam: Physical Exam ROS Blood pressure 97/79, pulse (!) 115, temperature 97.6 F (36.4 C), temperature source Oral, resp. rate 16, height 5' 3 (1.6 m), weight 79.4 kg, last menstrual period 08/06/2024, SpO2 96%. Body mass index is 31 kg/m.   Treatment Plan Summary: Daily contact with patient to assess and evaluate symptoms and progress in treatment and Medication management  TIJUANA SCHEIDEGGER is a 39 yr old female who presented on 1/11 to [facility] with SI and HI after breaking up/fighting with her boyfriend due to him cheating, she was admitted to [facility] on 1/12.  PPHx is significant for OCD, Depression, PTSD, and 1 Prior Psychiatric Hospitalization (06/2019), and no Prior Suicide Attempts or Self Injurious Behavior.   Korrina has tolerated the increase in Luvox  without issue.  She is interacting more in the milieu.  If she continues to do well we will plan for discharge tomorrow.  We will not make any changes  to her medications at this time.  We will  continue to monitor.   MDD, Recurrent, Severe, w/out Psychosis  PTSD  Mixed Obsessional Thought and Acts: -Continue Luvox  75 mg daily for depression and obsessive thoughts -Continue Doxepin  50 mg QHS for depression and obsessive thoughts -Continue Agitation Protocol: Haldol /Ativan /Benadryl    -Continue PRN's: Tylenol , Maalox, Atarax , Milk of Magnesia   --  The risks/benefits/side-effects/alternatives to medications were discussed in detail with the patient and time was given for questions. The patient consents to medication trials.                            -- Encouraged patient to participate in unit milieu and in scheduled group therapies              -- Short Term Goals: Ability to identify changes in lifestyle to reduce recurrence of condition will improve, Ability to verbalize feelings will improve, Ability to disclose and discuss suicidal ideas, Ability to demonstrate self-control will improve, Ability to identify and develop effective coping behaviors will improve, Ability to maintain clinical measurements within normal limits will improve, Compliance with prescribed medications will improve, and Ability to identify triggers associated with substance abuse/mental health issues will improve             -- Long Term Goals: Improvement in symptoms so as ready for discharge   Safety and Monitoring:             -- Voluntary admission to inpatient psychiatric unit for safety, stabilization and treatment             -- Daily contact with patient to assess and evaluate symptoms and progress in treatment             -- Patient's case to be discussed in multi-disciplinary team meeting             -- Observation Level : q15 minute checks             -- Vital signs:  q12 hours             -- Precautions: suicide, elopement, and assault  Discharge Planning:              -- Social work and case management to assist with discharge planning and identification of hospital follow-up needs prior  to discharge             -- Estimated LOS: 1-2 more days             -- Discharge Concerns: Need to establish a safety plan; Medication compliance and effectiveness             -- Discharge Goals: Return home with outpatient referrals for mental health follow-up including medication management/psychotherapy    Marsa GORMAN Rosser, DO 10/22/2024, 9:29 AM

## 2024-10-22 NOTE — Group Note (Signed)
 Recreation Therapy Group Note   Group Topic:Communication  Group Date: 10/22/2024 Start Time: 0940 End Time: 1005 Facilitators: Jannie Doyle-McCall, LRT,CTRS Location: [address] Dayroom   Group Topic: Communication, Team Building, Problem Solving  Goal Area(s) Addresses:  Patient will effectively work with peer towards shared goal.  Patient will identify skills used to make activity successful.  Patient will identify how skills used during activity can be applied to reach post d/c goals.   Behavioral Response:   Intervention: STEM Activity- Glass Blower/designer  Activity: Tallest [employer]. In teams of 5-6, patients were given 11 craft pipe cleaners. Using the materials provided, patients were instructed to compete again the opposing team(s) to build the tallest free-standing structure from floor level. The activity was timed; difficulty increased by clinical research associate as production designer, theatre/television/film continued.  Systematically resources were removed with additional directions for example, placing one arm behind their back, working in silence, and shape stipulations. LRT facilitated post-activity discussion reviewing team processes and necessary communication skills involved in completion. Patients were encouraged to reflect how the skills utilized, or not utilized, in this activity can be incorporated to positively impact support systems post discharge.  Education: Pharmacist, Community, Scientist, Physiological, Discharge Planning   Education Outcome: Acknowledges education/In group clarification offered/Needs additional education.    Affect/Mood: N/A   Participation Level: Did not attend    Clinical Observations/Individualized Feedback:      Plan: Continue to engage patient in RT group sessions 2-3x/week.   Deshon Hsiao-McCall, LRT,CTRS 10/22/2024 12:45 PM

## 2024-10-22 NOTE — Group Note (Deleted)
 Date:  10/22/2024 Time:  9:24 AM  Group Topic/Focus:  Goals Group:   The focus of this group is to help patients establish daily goals to achieve during treatment and discuss how the patient can incorporate goal setting into their daily lives to aide in recovery. Orientation:   The focus of this group is to educate the patient on the purpose and policies of crisis stabilization and provide a format to answer questions about their admission.  The group details unit policies and expectations of patients while admitted.     Participation Level:  {BHH PARTICIPATION OZCZO:77735}  Participation Quality:  {BHH PARTICIPATION QUALITY:22265}  Affect:  {BHH AFFECT:22266}  Cognitive:  {BHH COGNITIVE:22267}  Insight: {BHH Insight2:20797}  Engagement in Group:  {BHH ENGAGEMENT IN HMNLE:77731}  Modes of Intervention:  {BHH MODES OF INTERVENTION:22269}  Additional Comments:  ***  Mazal Ebey 10/22/2024, 9:24 AM

## 2024-10-22 NOTE — Group Note (Deleted)
 Date:  10/22/2024 Time:  8:41 AM  Group Topic/Focus:  Orientation:   The focus of this group is to educate the patient on the purpose and policies of crisis stabilization and provide a format to answer questions about their admission.  The group details unit policies and expectations of patients while admitted.     Participation Level:  {BHH PARTICIPATION OZCZO:77735}  Participation Quality:  {BHH PARTICIPATION QUALITY:22265}  Affect:  {BHH AFFECT:22266}  Cognitive:  {BHH COGNITIVE:22267}  Insight: {BHH Insight2:20797}  Engagement in Group:  {BHH ENGAGEMENT IN HMNLE:77731}  Modes of Intervention:  {BHH MODES OF INTERVENTION:22269}  Additional Comments:  ***  Kaylyn Garrow 10/22/2024, 8:41 AM

## 2024-10-22 NOTE — Group Note (Signed)
 Date:  10/22/2024 Time:  9:25 AM  Group Topic/Focus:  Goals Group:   The focus of this group is to help patients establish daily goals to achieve during treatment and discuss how the patient can incorporate goal setting into their daily lives to aide in recovery. Orientation:   The focus of this group is to educate the patient on the purpose and policies of crisis stabilization and provide a format to answer questions about their admission.  The group details unit policies and expectations of patients while admitted.    Participation Level:  Did Not Attend   Matha Masse 10/22/2024, 9:25 AM

## 2024-10-23 MED ORDER — FLUVOXAMINE MALEATE 50 MG PO TABS: 75.0000 mg | ORAL_TABLET | Freq: Every day | ORAL | 0 refills | Status: AC

## 2024-10-23 MED ORDER — DOXEPIN HCL 50 MG PO CAPS: 50.0000 mg | ORAL_CAPSULE | Freq: Every day | ORAL | 0 refills | Status: AC

## 2024-10-23 NOTE — Progress Notes (Signed)
(  Sleep Hours) -6.5 as of 0530 (Any PRNs that were needed, meds refused, or side effects to meds)- none (Any disturbances and when (visitation, over night)-none (Concerns raised by the patient)- none (SI/HI/AVH)- denies all

## 2024-10-23 NOTE — [facility] Suicide Risk Assessment (Signed)
 [facility] Discharge Suicide Risk Assessment   Principal Problem: MDD (major depressive disorder), recurrent episode, severe (HCC) Discharge Diagnoses: Principal Problem:   MDD (major depressive disorder), recurrent episode, severe (HCC) Active Problems:   PTSD (post-traumatic stress disorder)   Mixed obsessional thoughts and acts   During the patient's hospitalization, patient had extensive initial psychiatric evaluation, and follow-up psychiatric evaluations every day.  Psychiatric diagnoses provided upon initial assessment:  MDD (major depressive disorder), recurrent episode, severe (HCC) Active Problems:   PTSD (post-traumatic stress disorder)   Mixed obsessional thoughts and acts  Patient's psychiatric medications were adjusted on admission: Continued on her home Doxepin  and Luvox .  During the hospitalization, other adjustments were made to the patient's psychiatric medication regimen: Her Lamictal  was stopped due to lack of efficacy.  Her Luvox  was titrated.  Gradually, patient started adjusting to milieu.   Patient's care was discussed during the interdisciplinary team meeting every day during the hospitalization.  The patient is not having side effects to prescribed psychiatric medication.  The patient reports their target psychiatric symptoms of depression and SI responded well to the psychiatric medications, and the patient reports overall benefit other psychiatric hospitalization. Supportive psychotherapy was provided to the patient. The patient also participated in regular group therapy while admitted.   Labs were reviewed with the patient, and abnormal results were discussed with the patient.  The patient denied having suicidal thoughts more than 48 hours prior to discharge.  Patient denies having homicidal thoughts.  Patient denies having auditory hallucinations.  Patient denies any visual hallucinations.  Patient denies having paranoid thoughts.  The patient is able to  verbalize their individual safety plan to this provider.  It is recommended to the patient to continue psychiatric medications as prescribed, after discharge from the hospital.    It is recommended to the patient to follow up with your outpatient psychiatric provider and PCP.  Discussed with the patient, the impact of alcohol, drugs, tobacco have been there overall psychiatric and medical wellbeing, and total abstinence from substance use was recommended the patient.  Total Time spent with patient: 20 minutes  Musculoskeletal: Strength & Muscle Tone: within normal limits Gait & Station: normal Patient leans: N/A  Psychiatric Specialty Exam  Presentation  General Appearance:  Appropriate for Environment; Casual  Eye Contact: Good  Speech: Clear and Coherent; Normal Rate  Speech Volume: Normal  Handedness: Right   Mood and Affect  Mood: Euthymic  Duration of Depression Symptoms: Greater than two weeks  Affect: Congruent; Appropriate   Thought Process  Thought Processes: Coherent; Goal Directed  Descriptions of Associations:Intact  Orientation:Full (Time, Place and Person)  Thought Content:Logical; WDL  History of Schizophrenia/Schizoaffective disorder:No  Duration of Psychotic Symptoms:No data recorded Hallucinations:Hallucinations: None  Ideas of Reference:None  Suicidal Thoughts:Suicidal Thoughts: No  Homicidal Thoughts:Homicidal Thoughts: No   Sensorium  Memory: Immediate Fair; Recent Fair  Judgment: Good  Insight: Good   Executive Functions  Concentration: Good  Attention Span: Good  Recall: Good  Fund of Knowledge: Good  Language: Good   Psychomotor Activity  Psychomotor Activity: Psychomotor Activity: Normal   Assets  Assets: Communication Skills; Desire for Improvement; Resilience; Physical Health; Social Support; Housing   Sleep  Sleep: Sleep: Good  Estimated Sleeping Duration (Last 24 Hours): 5.75-6.75  hours  Physical Exam: Physical Exam Vitals and nursing note reviewed.  Constitutional:      General: She is not in acute distress.    Appearance: Normal appearance. She is normal weight. She is not ill-appearing or  toxic-appearing.  HENT:     Head: Normocephalic and atraumatic.  Pulmonary:     Effort: Pulmonary effort is normal.  Musculoskeletal:        General: Normal range of motion.  Neurological:     General: No focal deficit present.     Mental Status: She is alert.    Review of Systems  Respiratory:  Negative for cough and shortness of breath.   Cardiovascular:  Negative for chest pain.  Gastrointestinal:  Negative for abdominal pain, constipation, diarrhea, nausea and vomiting.  Neurological:  Negative for dizziness, weakness and headaches.  Psychiatric/Behavioral:  Negative for depression, hallucinations and suicidal ideas. The patient is not nervous/anxious.    Blood pressure 102/79, pulse 95, temperature 97.6 F (36.4 C), temperature source Oral, resp. rate 16, height 5' 3 (1.6 m), weight 79.4 kg, last menstrual period 08/06/2024, SpO2 (!) 87%. Body mass index is 31 kg/m.  Mental Status Per Nursing Assessment::   On Admission:  Suicidal ideation indicated by others, Suicide plan, Self-harm thoughts, Thoughts of violence towards others  Demographic Factors:  NA  Loss Factors: Loss of significant relationship and Decline in physical health  Historical Factors: NA  Risk Reduction Factors:   Positive social support, Positive therapeutic relationship, and Positive coping skills or problem solving skills  Continued Clinical Symptoms:  More than one psychiatric diagnosis Previous Psychiatric Diagnoses and Treatments  Cognitive Features That Contribute To Risk:  None    Suicide Risk:  Minimal: No identifiable suicidal ideation.  Patients presenting with no risk factors but with morbid ruminations; may be classified as minimal risk based on the severity of the  depressive symptoms   Follow-up Information     [facility] Follow up on 10/30/2024.   Why: You have a hospital follow up appointment for therapy and medication management services on 10/30/24 @ 10 am, Virt[facility]. Contact information: [address]  [address] [place] [address] [phone]                 Plan Of Care/Follow-up recommendations:  Activity: as tolerated  Diet: heart healthy  Other: -Follow-up with your outpatient psychiatric provider -instructions on appointment date, time, and address (location) are provided to you in discharge paperwork.  -Take your psychiatric medications as prescribed at discharge - instructions are provided to you in the discharge paperwork  -Follow-up with outpatient primary care doctor and other specialists -for management of chronic medical disease, including: Routine Care.  -Testing: Follow-up with outpatient provider for abnormal lab results: None  -Recommend abstinence from alcohol, tobacco, and other illicit drug use at discharge.   -If your psychiatric symptoms recur, worsen, or if you have side effects to your psychiatric medications, call your outpatient psychiatric provider, 911, [phone] or go to the nearest emergency department.  -If suicidal thoughts recur, call your outpatient psychiatric provider, 911, [phone] or go to the nearest emergency department.   Marsa GORMAN Rosser, DO 10/23/2024, 8:13 AM

## 2024-10-23 NOTE — Group Note (Signed)
 Date:  10/23/2024 Time:  9:45 AM  Group Topic/Focus: Goal and orientation  Goals Group:   The focus of this group is to help patients establish daily goals to achieve during treatment and discuss how the patient can incorporate goal setting into their daily lives to aide in recovery. Orientation:   The focus of this group is to educate the patient on the purpose and policies of crisis stabilization and provide a format to answer questions about their admission.  The group details unit policies and expectations of patients while admitted.    Participation Level:  Did Not Attend   Bonnie Turner 10/23/2024, 9:45 AM

## 2024-10-23 NOTE — Group Note (Signed)
 Date:  10/23/2024 Time:  10:09 AM  Group Topic/Focus: Nutrient Group  For adult patients, mental health is strongly supported by adequate intake of key nutrient groups that maintain brain function and emotional regulation. Complex carbohydrates provide a steady source of energy and support serotonin production, while proteins supply essential amino acids needed for neurotransmitters such as dopamine and serotonin. Healthy fats, particularly omega-3 fatty acids, are vital for brain cell membrane integrity and cognitive function. Micronutrients including B-complex vitamins, vitamin D , magnesium , zinc, and iron play critical roles in mood regulation, stress response, and neural signaling, with deficiencies often linked to depression and anxiety. Antioxidants from fruits and vegetables help protect the brain from oxidative stress, and dietary fiber along with probiotics supports the gut-brain axis, which influences mood and behavior. Adequate hydration further supports concentration, energy levels, and emotional stability.    Participation Level:  Did Not Attend   Bonnie Turner 10/23/2024, 10:09 AM

## 2024-10-23 NOTE — Progress Notes (Signed)
" °  [facility] Adult Case Management Discharge Plan :  Will you be returning to the same living situation after discharge:  Yes,  patient will be returning home.  At discharge, do you have transportation home?: Yes,  patient's brother, Druanne Bosques, will be providing transportation at 10am.  Do you have the ability to pay for your medications: Yes,  patient has active health insurance.   Release of information consent forms completed and in the chart;  Patient's signature needed at discharge.  Patient to Follow up at:  Follow-up Information     [facility] Follow up on 10/30/2024.   Why: You have a hospital follow up appointment for therapy and medication management services on 10/30/24 @ 10 am, [facility]. Contact information: [address]  [address] [place] [address] [phone]                 Next level of care provider has access to [facility] Link:no  Safety Planning and Suicide Prevention discussed: Yes,  completed with patient.  Suicide Prevention Education was reviewed thoroughly with patient, including risk factors, warning signs, and what to do. Mobile Crisis services were described and that telephone number pointed out, with encouragement to patient to put this number in personal cell phone. Brochure was provided to patient to share with natural supports. Patient acknowledged the ways in which they are at risk, and how working through each of their issues can gradually start to reduce their risk factors. Patient was encouraged to think of the information in the context of people in their own lives. Patient denied having access to firearms Patient verbalized understanding of information provided. Patient endorsed a desire to live.      Has patient been referred to the Quitline?: Patient refused referral for treatment  Patient has been referred for addiction treatment: No known substance use disorder.  Louetta Lame, LCSWA 10/23/2024, 8:56 AM "

## 2024-10-23 NOTE — Progress Notes (Signed)
" °   10/23/24 0800  Psych Admission Type (Psych Patients Only)  Admission Status Voluntary  Psychosocial Assessment  Patient Complaints None  Eye Contact Fair  Facial Expression Flat  Affect Appropriate to circumstance  Speech Logical/coherent  Interaction Assertive  Motor Activity Other (Comment) (steady gait)  Appearance/Hygiene Excess accessories;Layered clothes  Behavior Characteristics Cooperative  Mood Depressed  Aggressive Behavior  Effect No apparent injury  Thought Process  Coherency WDL  Content WDL  Delusions None reported or observed  Perception WDL  Hallucination None reported or observed  Judgment Impaired  Confusion None  Danger to Self  Current suicidal ideation? Denies  Danger to Others  Danger to Others None reported or observed  Danger to Others Abnormal  Harmful Behavior to others No threats or harm toward other people  Destructive Behavior No threats or harm toward property    "
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
## 2024-10-23 NOTE — Progress Notes (Signed)
Pt discharged to lobby. Pt was stable and appreciative at that time. All papers and prescriptions were given and valuables returned. Suicide safety plan completed. Verbal understanding expressed. Denies SI/HI and A/VH. Pt given opportunity to express concerns and ask questions.
